# Patient Record
Sex: Male | Born: 1935 | Race: White | Hispanic: No | Smoking: Former smoker
Health system: Southern US, Community
[De-identification: ages and names within clinical notes are randomized; demographics above are authoritative.]

## PROBLEM LIST (undated history)

## (undated) DIAGNOSIS — K219 Gastro-esophageal reflux disease without esophagitis: Secondary | ICD-10-CM

## (undated) DIAGNOSIS — E119 Type 2 diabetes mellitus without complications: Secondary | ICD-10-CM

## (undated) DIAGNOSIS — H3550 Unspecified hereditary retinal dystrophy: Secondary | ICD-10-CM

## (undated) DIAGNOSIS — E7521 Fabry (-Anderson) disease: Secondary | ICD-10-CM

## (undated) DIAGNOSIS — I10 Essential (primary) hypertension: Secondary | ICD-10-CM

## (undated) HISTORY — DX: Fabry (-anderson) disease: E75.21

## (undated) HISTORY — PX: OTHER SURGICAL HISTORY: SHX169

---

## 2012-10-08 LAB — HM COLONOSCOPY

## 2014-07-18 DIAGNOSIS — E785 Hyperlipidemia, unspecified: Secondary | ICD-10-CM | POA: Diagnosis not present

## 2014-07-18 DIAGNOSIS — E119 Type 2 diabetes mellitus without complications: Secondary | ICD-10-CM | POA: Diagnosis not present

## 2014-07-18 DIAGNOSIS — R21 Rash and other nonspecific skin eruption: Secondary | ICD-10-CM | POA: Diagnosis not present

## 2014-07-18 DIAGNOSIS — Z0000 Encounter for general adult medical examination without abnormal findings: Secondary | ICD-10-CM | POA: Diagnosis not present

## 2014-07-18 DIAGNOSIS — G4700 Insomnia, unspecified: Secondary | ICD-10-CM | POA: Diagnosis not present

## 2014-07-18 DIAGNOSIS — F329 Major depressive disorder, single episode, unspecified: Secondary | ICD-10-CM | POA: Diagnosis not present

## 2014-07-18 DIAGNOSIS — I10 Essential (primary) hypertension: Secondary | ICD-10-CM | POA: Diagnosis not present

## 2014-07-18 DIAGNOSIS — Z23 Encounter for immunization: Secondary | ICD-10-CM | POA: Diagnosis not present

## 2014-07-18 DIAGNOSIS — F419 Anxiety disorder, unspecified: Secondary | ICD-10-CM | POA: Diagnosis not present

## 2014-07-27 DIAGNOSIS — I77819 Aortic ectasia, unspecified site: Secondary | ICD-10-CM | POA: Diagnosis not present

## 2014-07-27 DIAGNOSIS — Z136 Encounter for screening for cardiovascular disorders: Secondary | ICD-10-CM | POA: Diagnosis not present

## 2014-07-27 DIAGNOSIS — M899 Disorder of bone, unspecified: Secondary | ICD-10-CM | POA: Diagnosis not present

## 2014-07-27 DIAGNOSIS — M8580 Other specified disorders of bone density and structure, unspecified site: Secondary | ICD-10-CM | POA: Diagnosis not present

## 2015-01-25 DIAGNOSIS — E119 Type 2 diabetes mellitus without complications: Secondary | ICD-10-CM | POA: Diagnosis not present

## 2015-01-25 DIAGNOSIS — I10 Essential (primary) hypertension: Secondary | ICD-10-CM | POA: Diagnosis not present

## 2015-01-25 DIAGNOSIS — F4323 Adjustment disorder with mixed anxiety and depressed mood: Secondary | ICD-10-CM | POA: Diagnosis not present

## 2015-01-25 DIAGNOSIS — F418 Other specified anxiety disorders: Secondary | ICD-10-CM | POA: Diagnosis not present

## 2015-01-25 DIAGNOSIS — E785 Hyperlipidemia, unspecified: Secondary | ICD-10-CM | POA: Diagnosis not present

## 2015-01-25 DIAGNOSIS — Z0000 Encounter for general adult medical examination without abnormal findings: Secondary | ICD-10-CM | POA: Diagnosis not present

## 2015-01-25 DIAGNOSIS — R21 Rash and other nonspecific skin eruption: Secondary | ICD-10-CM | POA: Diagnosis not present

## 2015-06-21 DIAGNOSIS — E119 Type 2 diabetes mellitus without complications: Secondary | ICD-10-CM | POA: Diagnosis not present

## 2015-06-21 DIAGNOSIS — H353213 Exudative age-related macular degeneration, right eye, with inactive scar: Secondary | ICD-10-CM | POA: Diagnosis not present

## 2015-06-21 DIAGNOSIS — H353223 Exudative age-related macular degeneration, left eye, with inactive scar: Secondary | ICD-10-CM | POA: Diagnosis not present

## 2015-06-21 DIAGNOSIS — H539 Unspecified visual disturbance: Secondary | ICD-10-CM | POA: Diagnosis not present

## 2015-07-28 DIAGNOSIS — E7800 Pure hypercholesterolemia, unspecified: Secondary | ICD-10-CM | POA: Diagnosis not present

## 2015-07-28 DIAGNOSIS — I10 Essential (primary) hypertension: Secondary | ICD-10-CM | POA: Diagnosis not present

## 2015-07-28 DIAGNOSIS — E119 Type 2 diabetes mellitus without complications: Secondary | ICD-10-CM | POA: Diagnosis not present

## 2015-07-28 DIAGNOSIS — K2270 Barrett's esophagus without dysplasia: Secondary | ICD-10-CM | POA: Insufficient documentation

## 2015-07-28 DIAGNOSIS — F4329 Adjustment disorder with other symptoms: Secondary | ICD-10-CM | POA: Insufficient documentation

## 2015-07-28 DIAGNOSIS — F5101 Primary insomnia: Secondary | ICD-10-CM | POA: Diagnosis not present

## 2015-07-28 DIAGNOSIS — I77819 Aortic ectasia, unspecified site: Secondary | ICD-10-CM | POA: Insufficient documentation

## 2015-07-28 DIAGNOSIS — F4323 Adjustment disorder with mixed anxiety and depressed mood: Secondary | ICD-10-CM | POA: Diagnosis not present

## 2015-07-28 DIAGNOSIS — Z0000 Encounter for general adult medical examination without abnormal findings: Secondary | ICD-10-CM | POA: Diagnosis not present

## 2015-08-09 DIAGNOSIS — Z9841 Cataract extraction status, right eye: Secondary | ICD-10-CM | POA: Diagnosis not present

## 2015-08-09 DIAGNOSIS — R001 Bradycardia, unspecified: Secondary | ICD-10-CM | POA: Diagnosis not present

## 2015-08-09 DIAGNOSIS — E785 Hyperlipidemia, unspecified: Secondary | ICD-10-CM | POA: Diagnosis not present

## 2015-08-09 DIAGNOSIS — I10 Essential (primary) hypertension: Secondary | ICD-10-CM | POA: Diagnosis not present

## 2015-08-09 DIAGNOSIS — R51 Headache: Secondary | ICD-10-CM | POA: Diagnosis not present

## 2015-08-09 DIAGNOSIS — E119 Type 2 diabetes mellitus without complications: Secondary | ICD-10-CM | POA: Diagnosis not present

## 2015-08-09 DIAGNOSIS — Z9842 Cataract extraction status, left eye: Secondary | ICD-10-CM | POA: Diagnosis not present

## 2015-08-09 DIAGNOSIS — Z7984 Long term (current) use of oral hypoglycemic drugs: Secondary | ICD-10-CM | POA: Diagnosis not present

## 2015-08-09 DIAGNOSIS — G4730 Sleep apnea, unspecified: Secondary | ICD-10-CM | POA: Diagnosis not present

## 2015-08-09 DIAGNOSIS — Z961 Presence of intraocular lens: Secondary | ICD-10-CM | POA: Diagnosis not present

## 2015-08-09 DIAGNOSIS — Z7982 Long term (current) use of aspirin: Secondary | ICD-10-CM | POA: Diagnosis not present

## 2015-08-09 DIAGNOSIS — R531 Weakness: Secondary | ICD-10-CM | POA: Diagnosis not present

## 2015-08-09 DIAGNOSIS — H3530 Unspecified macular degeneration: Secondary | ICD-10-CM | POA: Diagnosis not present

## 2015-08-09 DIAGNOSIS — Z91030 Bee allergy status: Secondary | ICD-10-CM | POA: Diagnosis not present

## 2015-08-09 DIAGNOSIS — H409 Unspecified glaucoma: Secondary | ICD-10-CM | POA: Diagnosis not present

## 2015-08-09 DIAGNOSIS — F17200 Nicotine dependence, unspecified, uncomplicated: Secondary | ICD-10-CM | POA: Diagnosis not present

## 2015-08-09 DIAGNOSIS — Z79899 Other long term (current) drug therapy: Secondary | ICD-10-CM | POA: Diagnosis not present

## 2015-08-09 DIAGNOSIS — G319 Degenerative disease of nervous system, unspecified: Secondary | ICD-10-CM | POA: Diagnosis not present

## 2015-08-14 DIAGNOSIS — N281 Cyst of kidney, acquired: Secondary | ICD-10-CM | POA: Diagnosis not present

## 2015-08-14 DIAGNOSIS — E785 Hyperlipidemia, unspecified: Secondary | ICD-10-CM | POA: Diagnosis not present

## 2015-08-14 DIAGNOSIS — Z7984 Long term (current) use of oral hypoglycemic drugs: Secondary | ICD-10-CM | POA: Diagnosis not present

## 2015-08-14 DIAGNOSIS — F17290 Nicotine dependence, other tobacco product, uncomplicated: Secondary | ICD-10-CM | POA: Diagnosis not present

## 2015-08-14 DIAGNOSIS — Z79899 Other long term (current) drug therapy: Secondary | ICD-10-CM | POA: Diagnosis not present

## 2015-08-14 DIAGNOSIS — Z7982 Long term (current) use of aspirin: Secondary | ICD-10-CM | POA: Diagnosis not present

## 2015-08-14 DIAGNOSIS — K8080 Other cholelithiasis without obstruction: Secondary | ICD-10-CM | POA: Diagnosis not present

## 2015-08-14 DIAGNOSIS — N390 Urinary tract infection, site not specified: Secondary | ICD-10-CM | POA: Diagnosis not present

## 2015-08-14 DIAGNOSIS — Z91030 Bee allergy status: Secondary | ICD-10-CM | POA: Diagnosis not present

## 2015-08-14 DIAGNOSIS — S76211A Strain of adductor muscle, fascia and tendon of right thigh, initial encounter: Secondary | ICD-10-CM | POA: Diagnosis not present

## 2015-08-14 DIAGNOSIS — K8020 Calculus of gallbladder without cholecystitis without obstruction: Secondary | ICD-10-CM | POA: Diagnosis not present

## 2015-08-14 DIAGNOSIS — E119 Type 2 diabetes mellitus without complications: Secondary | ICD-10-CM | POA: Diagnosis not present

## 2015-08-14 DIAGNOSIS — S76212A Strain of adductor muscle, fascia and tendon of left thigh, initial encounter: Secondary | ICD-10-CM | POA: Diagnosis not present

## 2015-08-14 DIAGNOSIS — I10 Essential (primary) hypertension: Secondary | ICD-10-CM | POA: Diagnosis not present

## 2015-08-17 DIAGNOSIS — R1032 Left lower quadrant pain: Secondary | ICD-10-CM | POA: Diagnosis not present

## 2016-01-31 DIAGNOSIS — E1142 Type 2 diabetes mellitus with diabetic polyneuropathy: Secondary | ICD-10-CM | POA: Diagnosis not present

## 2016-01-31 DIAGNOSIS — H409 Unspecified glaucoma: Secondary | ICD-10-CM | POA: Insufficient documentation

## 2016-01-31 DIAGNOSIS — I77819 Aortic ectasia, unspecified site: Secondary | ICD-10-CM | POA: Diagnosis not present

## 2016-01-31 DIAGNOSIS — E119 Type 2 diabetes mellitus without complications: Secondary | ICD-10-CM | POA: Diagnosis not present

## 2016-01-31 DIAGNOSIS — F332 Major depressive disorder, recurrent severe without psychotic features: Secondary | ICD-10-CM | POA: Diagnosis not present

## 2016-01-31 DIAGNOSIS — I10 Essential (primary) hypertension: Secondary | ICD-10-CM | POA: Diagnosis not present

## 2016-02-16 DIAGNOSIS — R899 Unspecified abnormal finding in specimens from other organs, systems and tissues: Secondary | ICD-10-CM | POA: Diagnosis not present

## 2016-03-06 DIAGNOSIS — R899 Unspecified abnormal finding in specimens from other organs, systems and tissues: Secondary | ICD-10-CM | POA: Diagnosis not present

## 2016-04-26 DIAGNOSIS — N390 Urinary tract infection, site not specified: Secondary | ICD-10-CM | POA: Diagnosis not present

## 2016-05-20 ENCOUNTER — Emergency Department (HOSPITAL_COMMUNITY): Payer: Medicare Other

## 2016-05-20 ENCOUNTER — Encounter (HOSPITAL_COMMUNITY): Payer: Self-pay | Admitting: *Deleted

## 2016-05-20 ENCOUNTER — Emergency Department (HOSPITAL_COMMUNITY)
Admission: EM | Admit: 2016-05-20 | Discharge: 2016-05-20 | Disposition: A | Discharge status: Home / Self Care | Payer: Medicare Other | Attending: Emergency Medicine | Admitting: Emergency Medicine

## 2016-05-20 DIAGNOSIS — R443 Hallucinations, unspecified: Secondary | ICD-10-CM | POA: Insufficient documentation

## 2016-05-20 DIAGNOSIS — E119 Type 2 diabetes mellitus without complications: Secondary | ICD-10-CM | POA: Diagnosis not present

## 2016-05-20 DIAGNOSIS — N390 Urinary tract infection, site not specified: Secondary | ICD-10-CM | POA: Diagnosis not present

## 2016-05-20 DIAGNOSIS — I10 Essential (primary) hypertension: Secondary | ICD-10-CM | POA: Diagnosis not present

## 2016-05-20 DIAGNOSIS — B354 Tinea corporis: Secondary | ICD-10-CM | POA: Insufficient documentation

## 2016-05-20 DIAGNOSIS — R410 Disorientation, unspecified: Secondary | ICD-10-CM | POA: Diagnosis not present

## 2016-05-20 DIAGNOSIS — R208 Other disturbances of skin sensation: Secondary | ICD-10-CM | POA: Diagnosis present

## 2016-05-20 HISTORY — DX: Essential (primary) hypertension: I10

## 2016-05-20 HISTORY — DX: Type 2 diabetes mellitus without complications: E11.9

## 2016-05-20 HISTORY — DX: Gastro-esophageal reflux disease without esophagitis: K21.9

## 2016-05-20 HISTORY — DX: Unspecified hereditary retinal dystrophy: H35.50

## 2016-05-20 LAB — CBC WITH DIFFERENTIAL/PLATELET
Basophils Absolute: 0 10*3/uL (ref 0.0–0.1)
Basophils Relative: 0 %
Eosinophils Absolute: 0.3 10*3/uL (ref 0.0–0.7)
Eosinophils Relative: 5 %
HCT: 37.5 % — ABNORMAL LOW (ref 39.0–52.0)
Hemoglobin: 12.6 g/dL — ABNORMAL LOW (ref 13.0–17.0)
Lymphocytes Relative: 23 %
Lymphs Abs: 1.7 10*3/uL (ref 0.7–4.0)
MCH: 31.8 pg (ref 26.0–34.0)
MCHC: 33.6 g/dL (ref 30.0–36.0)
MCV: 94.7 fL (ref 78.0–100.0)
Monocytes Absolute: 0.8 10*3/uL (ref 0.1–1.0)
Monocytes Relative: 11 %
Neutro Abs: 4.5 10*3/uL (ref 1.7–7.7)
Neutrophils Relative %: 61 %
Platelets: 228 10*3/uL (ref 150–400)
RBC: 3.96 MIL/uL — ABNORMAL LOW (ref 4.22–5.81)
RDW: 13.7 % (ref 11.5–15.5)
WBC: 7.4 10*3/uL (ref 4.0–10.5)

## 2016-05-20 LAB — URINALYSIS, ROUTINE W REFLEX MICROSCOPIC
Bilirubin Urine: NEGATIVE
Glucose, UA: NEGATIVE mg/dL
Hgb urine dipstick: NEGATIVE
Ketones, ur: 5 mg/dL — AB
Nitrite: NEGATIVE
Protein, ur: NEGATIVE mg/dL
Specific Gravity, Urine: 1.021 (ref 1.005–1.030)
pH: 5 (ref 5.0–8.0)

## 2016-05-20 LAB — BASIC METABOLIC PANEL
Anion gap: 7 (ref 5–15)
BUN: 29 mg/dL — ABNORMAL HIGH (ref 6–20)
CO2: 28 mmol/L (ref 22–32)
Calcium: 9.5 mg/dL (ref 8.9–10.3)
Chloride: 103 mmol/L (ref 101–111)
Creatinine, Ser: 1.43 mg/dL — ABNORMAL HIGH (ref 0.61–1.24)
GFR calc Af Amer: 50 mL/min — ABNORMAL LOW (ref >60)
GFR calc non Af Amer: 43 mL/min — ABNORMAL LOW (ref >60)
Glucose, Bld: 129 mg/dL — ABNORMAL HIGH (ref 65–99)
Potassium: 4.3 mmol/L (ref 3.5–5.1)
Sodium: 138 mmol/L (ref 135–145)

## 2016-05-20 MED ORDER — CLOTRIMAZOLE 1 % EX CREA: 1.0000 "application " | TOPICAL_CREAM | Freq: Two times a day (BID) | CUTANEOUS | 0 refills | Status: AC

## 2016-05-20 MED ORDER — CEPHALEXIN 500 MG PO CAPS: 500.0000 mg | ORAL_CAPSULE | Freq: Two times a day (BID) | ORAL | 0 refills | Status: DC

## 2016-05-20 MED ORDER — QUETIAPINE FUMARATE 25 MG PO TABS: 25.0000 mg | ORAL_TABLET | Freq: Every day | ORAL | 0 refills | Status: DC

## 2016-05-20 MED ORDER — CEPHALEXIN 250 MG PO CAPS
500.0000 mg | ORAL_CAPSULE | Freq: Once | ORAL | Status: AC
  Administered 2016-05-20: 500 mg via ORAL
  Filled 2016-05-20: qty 2

## 2016-05-20 NOTE — ED Notes (Signed)
Got patient into a gown and on the monitor waiting for provider 

## 2016-05-20 NOTE — ED Notes (Signed)
Patient transported to CT 

## 2016-05-20 NOTE — ED Triage Notes (Signed)
Pt here with daughter for increasing confusion and hallucinations.  Until 9 weeks ago pt was ao x 4.  Then his pcp placed him on cymbalta for burning sensation in his feet and he began having hallucinations. 2 weeks later they stopped he med, but pt is not improving.  Pt is now not sleeping and is wanting to carry a gun to protect himself.  He is 95% blind and he is pointing out people in the yard who want to kill him.  Pt is also on ativan and has been smoking a lot more than normal.

## 2016-05-20 NOTE — ED Notes (Signed)
Pt unable to obtain urine specimen at this time, states "he will try in a little while when he feels like he has to go."

## 2016-05-20 NOTE — ED Provider Notes (Signed)
[facility]-EMERGENCY DEPT Provider Note   CSN: [id number] Arrival date & time: 05/20/16  [id number] By signing my name below, I, Sonum Patel, attest that this documentation has been prepared under the direction and in the presence of Raeford RazorStephen Stanislav Gervase, MD. Electronically Signed: Sonum Patel, Neurosurgeoncribe. 05/20/16. 10:19 AM. History   Chief Complaint Chief Complaint  Patient presents with  . Altered Mental Status    The history is provided by the patient and a relative. The history is limited by the condition of the patient. No language interpreter was used.    LEVEL 5 CAVEAT: ALTERED MENTAL STATUS HPI Comments: Thomasene MohairCharles Umland is a 82 y.o. male with past medical history of DM, HTN, macular dystrophy who presents to the Emergency Department with altered mental status that began 9 weeks ago. Daughters state patient was started on Cymbalta for burning sensations to his feet. They state patient began having hallucinations and became paranoid after starting the medication. He has stopped taking the medication 7 weeks ago but family states the symptoms are persisting. He has worsened wandering and insomnia. Family states patient is legally blind but reports seeing people who want to kill him. He was evaluated for a UTI a few weeks ago and was treated with antibiotics; family states this did not alleviate his psychological symptoms. He has a history of DM but was recently taken off of glipizide with CBGs of ~130. He has scheduled lorazepam but family states this has not been helpful.   PCP [facility]  Past Medical History:  Diagnosis Date  . Diabetes mellitus without complication (HCC)   . GERD (gastroesophageal reflux disease)   . Hypertension   . Macular dystrophy     There are no active problems to display for this patient.   Past Surgical History:  Procedure Laterality Date  . cataract Bilateral        Home Medications    Prior to Admission medications   Not on File    Family History No  family history on file.  Social History Social History  Substance Use Topics  . Smoking status: Never Smoker  . Smokeless tobacco: Current User    Types: Chew  . Alcohol use No     Allergies   Bee venom; Cymbalta [duloxetine hcl]; and Zoloft [sertraline hcl]   Review of Systems Review of Systems  Unable to perform ROS: Mental status change     Physical Exam Updated Vital Signs BP 127/64 (BP Location: Right Arm)   Pulse 72   Temp 97.5 F (36.4 C) (Oral)   Resp 20   Ht 5\' 11"  (1.803 m)   Wt 174 lb (78.9 kg)   SpO2 98%   BMI 24.27 kg/m   Physical Exam  Constitutional: He appears well-developed and well-nourished.  HENT:  Head: Normocephalic and atraumatic.  Eyes: EOM are normal.  Neck: Normal range of motion.  Cardiovascular: Normal rate, regular rhythm, normal heart sounds and intact distal pulses.   Pulmonary/Chest: Effort normal and breath sounds normal. No respiratory distress.  Abdominal: Soft. He exhibits no distension. There is no tenderness.  Musculoskeletal: Normal range of motion.  Neurological: He is alert.  Pleasant, follows commands. Mildly confused, repetitive questioning to family about people being across the street and burning things down. He is redirectable briefly.   Skin: Skin is warm and dry.  To the posterior right thigh there is a roughly circular lesion approximately 4 cm in diameter. Border is slightly raised and a little bit erythematous. Central part of the lesion  is pale in color and scaly. Overall, looks consistent with ringworm.  Psychiatric: He has a normal mood and affect. Judgment normal.  Nursing note and vitals reviewed.    ED Treatments / Results  DIAGNOSTIC STUDIES: Oxygen Saturation is 98% on RA, normal by my interpretation.    COORDINATION OF CARE: 10:22 AM Discussed treatment plan with pt and family at bedside and they agreed to plan.   Labs (all labs ordered are listed, but only abnormal results are displayed) Labs  Reviewed  URINE CULTURE - Abnormal; Notable for the following:       Result Value   Culture MULTIPLE SPECIES PRESENT, SUGGEST RECOLLECTION (*)    All other components within normal limits  CBC WITH DIFFERENTIAL/PLATELET - Abnormal; Notable for the following:    RBC 3.96 (*)    Hemoglobin 12.6 (*)    HCT 37.5 (*)    All other components within normal limits  BASIC METABOLIC PANEL - Abnormal; Notable for the following:    Glucose, Bld 129 (*)    BUN 29 (*)    Creatinine, Ser 1.43 (*)    GFR calc non Af Amer 43 (*)    GFR calc Af Amer 50 (*)    All other components within normal limits  URINALYSIS, ROUTINE W REFLEX MICROSCOPIC - Abnormal; Notable for the following:    APPearance HAZY (*)    Ketones, ur 5 (*)    Leukocytes, UA LARGE (*)    Bacteria, UA RARE (*)    Squamous Epithelial / LPF 0-5 (*)    All other components within normal limits    EKG  EKG Interpretation None       Radiology No results found.  Procedures Procedures (including critical care time)  Medications Ordered in ED Medications - No data to display   Initial Impression / Assessment and Plan / ED Course  I have reviewed the triage vital signs and the nursing notes.  Pertinent labs & imaging results that were available during my care of the patient were reviewed by me and considered in my medical decision making (see chart for details).  Clinical Course     82 year old male with change in his mental status. This is been ongoing for weeks. He may have urinary tract infection but this does not completely explain the timeline. Will treat him. Will send urine culture. Additionally, he has a rash to his posterior right thigh which is consistent with ringworm. Treatment was discussed with the family. Medically, actually safer discharge. The nasal follow-up this PCP with his family to discuss his behavior changes further. I doubt that there emergent process and this is not a complain that can really  adequately be addressed the emergency room or hospitalization.  Final Clinical Impressions(s) / ED Diagnoses   Final diagnoses:  Hallucinations  Ringworm of body  Urinary tract infection without hematuria, site unspecified    New Prescriptions New Prescriptions   No medications on file   I personally preformed the services scribed in my presence. The recorded information has been reviewed is accurate. Raeford RazorStephen Neisha Hinger, MD.    Raeford RazorStephen Hawk Mones, MD 05/28/16 1100

## 2016-05-20 NOTE — ED Notes (Signed)
Pt given urinal to provide urine sample

## 2016-05-21 LAB — URINE CULTURE

## 2016-06-12 ENCOUNTER — Ambulatory Visit (INDEPENDENT_AMBULATORY_CARE_PROVIDER_SITE_OTHER): Payer: Medicare Other | Admitting: Internal Medicine

## 2016-06-12 ENCOUNTER — Encounter: Payer: Self-pay | Admitting: Internal Medicine

## 2016-06-12 VITALS — BP 118/68 | HR 63 | Temp 97.6°F | Ht 71.0 in | Wt 166.0 lb

## 2016-06-12 DIAGNOSIS — E1142 Type 2 diabetes mellitus with diabetic polyneuropathy: Secondary | ICD-10-CM | POA: Diagnosis not present

## 2016-06-12 DIAGNOSIS — G4700 Insomnia, unspecified: Secondary | ICD-10-CM | POA: Diagnosis not present

## 2016-06-12 DIAGNOSIS — R443 Hallucinations, unspecified: Secondary | ICD-10-CM

## 2016-06-12 DIAGNOSIS — K219 Gastro-esophageal reflux disease without esophagitis: Secondary | ICD-10-CM

## 2016-06-12 DIAGNOSIS — E782 Mixed hyperlipidemia: Secondary | ICD-10-CM

## 2016-06-12 DIAGNOSIS — F4329 Adjustment disorder with other symptoms: Secondary | ICD-10-CM | POA: Diagnosis not present

## 2016-06-12 DIAGNOSIS — B354 Tinea corporis: Secondary | ICD-10-CM

## 2016-06-12 DIAGNOSIS — H548 Legal blindness, as defined in USA: Secondary | ICD-10-CM | POA: Diagnosis not present

## 2016-06-12 DIAGNOSIS — I10 Essential (primary) hypertension: Secondary | ICD-10-CM

## 2016-06-12 LAB — TSH: TSH: 2.11 m[IU]/L (ref 0.40–4.50)

## 2016-06-12 LAB — HEMOGLOBIN A1C
HEMOGLOBIN A1C: 6 % — AB (ref <5.7)
Mean Plasma Glucose: 126 mg/dL

## 2016-06-12 LAB — LIPID PANEL
Cholesterol: 211 mg/dL — ABNORMAL HIGH (ref <200)
HDL: 59 mg/dL (ref >40)
LDL Cholesterol: 129 mg/dL — ABNORMAL HIGH (ref <100)
Total CHOL/HDL Ratio: 3.6 Ratio (ref <5.0)
Triglycerides: 115 mg/dL (ref <150)
VLDL: 23 mg/dL (ref <30)

## 2016-06-12 LAB — COMPLETE METABOLIC PANEL WITH GFR
ALK PHOS: 114 U/L (ref 40–115)
ALT: 12 U/L (ref 9–46)
AST: 20 U/L (ref 10–35)
Albumin: 3.7 g/dL (ref 3.6–5.1)
BILIRUBIN TOTAL: 0.8 mg/dL (ref 0.2–1.2)
BUN: 38 mg/dL — ABNORMAL HIGH (ref 7–25)
CO2: 28 mmol/L (ref 20–31)
CREATININE: 1.37 mg/dL — AB (ref 0.70–1.11)
Calcium: 9.5 mg/dL (ref 8.6–10.3)
Chloride: 103 mmol/L (ref 98–110)
GFR, EST AFRICAN AMERICAN: 54 mL/min — AB (ref >=60)
GFR, EST NON AFRICAN AMERICAN: 47 mL/min — AB (ref >=60)
GLUCOSE: 139 mg/dL — AB (ref 65–99)
Potassium: 4.3 mmol/L (ref 3.5–5.3)
Sodium: 140 mmol/L (ref 135–146)
TOTAL PROTEIN: 6.4 g/dL (ref 6.1–8.1)

## 2016-06-12 MED ORDER — QUETIAPINE FUMARATE 25 MG PO TABS: 25.0000 mg | ORAL_TABLET | Freq: Every day | ORAL | 0 refills | Status: DC

## 2016-06-12 MED ORDER — MOEXIPRIL HCL 15 MG PO TABS: 15.0000 mg | ORAL_TABLET | Freq: Every day | ORAL | 1 refills | Status: DC

## 2016-06-12 MED ORDER — QUETIAPINE FUMARATE 25 MG PO TABS: 25.0000 mg | ORAL_TABLET | Freq: Every day | ORAL | 1 refills | Status: DC

## 2016-06-12 MED ORDER — KETOCONAZOLE 200 MG PO TABS: 200.0000 mg | ORAL_TABLET | Freq: Every day | ORAL | 0 refills | Status: DC

## 2016-06-12 MED ORDER — OMEPRAZOLE 20 MG PO TBEC: 40.0000 mg | DELAYED_RELEASE_TABLET | Freq: Two times a day (BID) | ORAL | 1 refills | Status: DC

## 2016-06-12 MED ORDER — ZOLPIDEM TARTRATE 5 MG PO TABS: 5.0000 mg | ORAL_TABLET | Freq: Every evening | ORAL | 3 refills | Status: DC | PRN

## 2016-06-12 MED ORDER — LORAZEPAM 0.5 MG PO TABS: 0.5000 mg | ORAL_TABLET | Freq: Two times a day (BID) | ORAL | 3 refills | Status: DC

## 2016-06-12 NOTE — Patient Instructions (Addendum)
Reduce zolpidem 5mg  at bedtime  Start ketoconazole daily x 14 days for rash  Continue other medications as ordered  Get old records  Follow up in 1-2 mos for AWV/CPE with MMSE/ECG

## 2016-06-12 NOTE — Progress Notes (Signed)
Patient ID: Brendan Spencer, male   DOB: [birth year], 83 y.o.   MRN: [id number]    Location:  [facility] Place of Service: OFFICE  Chief Complaint  Patient presents with  . Establish Care    New patient to establish care  . Medication Management    patient was taking off meds and ended up in hospital    HPI:  83 yo male seen today as a new pt. He has had decline in health over the last few mos. Increased visual and auditory hallucinations. Sx's began after starting cymbalta. He was evaluated in the ED at [facility] and dx with UTI tx with keflex and ring worm tx with OTC lotrimin. He has since stopped that medication and is now taking seroquel which was started on 12/11th. Family reports pt has delusions and hallucinations. He is c/a neighbors and states that they are damaging his home and they have an electronic machine that controls his TV, heat and they watch him shower. He hears voices that threaten they are going to kill him. Family c/a pt as he owns a weapon (.22 pistol) and keeps in the hallway end table beside his chair.  He has insomnia and difficulty staying asleep. He has no other concerns. He is a poor historian due to mental status. Hx obtained from chart and family  DM - last A1c some time ago and was <7%. Oral hypoglycemic stopped by previous PCP. No low BS reactions. No numbness/tingling  HTN - stable on ACEI  GERD - stable on omeprazole  Hyperlipidemia - currently diet controlled  Past Medical History:  Diagnosis Date  . Diabetes mellitus without complication (HCC)   . GERD (gastroesophageal reflux disease)   . GLA deficiency (HCC)   . Hypertension   . Macular dystrophy     Past Surgical History:  Procedure Laterality Date  . cataract Bilateral     Patient Care Team: Kirt Boys, DO as PCP - General (Internal Medicine)  Social History   Social History  . Marital status: Widowed    Spouse name: N/A  . Number of children: N/A  . Years of education: N/A    Occupational History  . Not on file.   Social History Main Topics  . Smoking status: Former Games developer  . Smokeless tobacco: Current User    Types: Chew  . Alcohol use No  . Drug use: No  . Sexual activity: Not on file   Other Topics Concern  . Not on file   Social History Narrative  . No narrative on file     reports that he has quit smoking. His smokeless tobacco use includes Chew. He reports that he does not drink alcohol or use drugs.  History reviewed. No pertinent family history. No family status information on file.     Allergies  Allergen Reactions  . Bee Venom Anaphylaxis  . Cymbalta [Duloxetine Hcl]     Hallucinations   . Zoloft [Sertraline Hcl]     Hallucinations; incontinence     Medications: Patient's Medications  New Prescriptions   No medications on file  Previous Medications   ACETAMINOPHEN (TYLENOL) 325 MG TABLET    Take 650 mg by mouth every 6 (six) hours as needed.   CHOLECALCIFEROL (D3-1000) 1000 UNITS TABLET    Take 1,000 Units by mouth daily.   CLOTRIMAZOLE (LOTRIMIN) 1 % CREAM    Apply 1 application topically 2 (two) times daily.   HYDROCODONE-ACETAMINOPHEN (NORCO) 10-325 MG TABLET    Take 1 tablet  by mouth every 6 (six) hours as needed.   LORAZEPAM (ATIVAN) 1 MG TABLET    Take 1 mg by mouth every 8 (eight) hours.   LUMIGAN 0.01 % SOLN    Place 2 drops into both eyes 2 (two) times daily.   MAGNESIUM OXIDE (MAG-OX) 400 MG TABLET    Take 400 mg by mouth daily.   MOEXIPRIL (UNIVASC) 15 MG TABLET    Take 15 mg by mouth daily.   MULTIPLE VITAMINS-MINERALS (PRESERVISION AREDS PO)    Take by mouth. Take 1 capsule in the morning and 1 capsule in the evening   OMEPRAZOLE 20 MG TBEC    Take 40 mg by mouth 2 (two) times daily before a meal.    PSYLLIUM (REGULOID) 0.52 G CAPSULE    Take 0.52 g by mouth daily.   QUETIAPINE (SEROQUEL) 25 MG TABLET    Take 1 tablet (25 mg total) by mouth at bedtime.  Modified Medications   No medications on file   Discontinued Medications   CEPHALEXIN (KEFLEX) 500 MG CAPSULE    Take 1 capsule (500 mg total) by mouth 2 (two) times daily.   OMEPRAZOLE (PRILOSEC) 40 MG CAPSULE    Take 40 mg by mouth 2 (two) times daily.   ZOLPIDEM (AMBIEN) 10 MG TABLET    Take 10 mg by mouth at bedtime as needed. SLEEP    Review of Systems  Constitutional: Positive for appetite change (LOSS), chills, fatigue and unexpected weight change (loss).  HENT: Positive for dental problem (wears dentures).        Nasal drainage  Eyes: Positive for pain and visual disturbance (reduced).  Respiratory: Positive for shortness of breath.   Gastrointestinal: Positive for constipation.       Hemorrhoids  Endocrine: Positive for cold intolerance, heat intolerance, polydipsia and polyuria.  Genitourinary: Positive for difficulty urinating (weak stream), frequency and urgency.  Musculoskeletal: Positive for arthralgias, gait problem and myalgias.  Skin:       itchy  Neurological: Positive for numbness and headaches.       Loss of balance;  Hematological: Bruises/bleeds easily.  Psychiatric/Behavioral: Positive for dysphoric mood, hallucinations (auditory; visual) and sleep disturbance. The patient is nervous/anxious.   All other systems reviewed and are negative. taken from new patient packet  Vitals:   06/12/16 0859  BP: 118/68  Pulse: 63  Temp: 97.6 F (36.4 C)  TempSrc: Oral  SpO2: 94%  Weight: 166 lb (75.3 kg)  Height: '5\' 11"'$  (1.803 m)   Body mass index is 23.15 kg/m.  Physical Exam  Constitutional: He is oriented to person, place, and time. He appears well-developed.  Frail appearing in NAD  HENT:  Mouth/Throat: Oropharynx is clear and moist.  Eyes: Pupils are equal, round, and reactive to light. No scleral icterus.  Neck: Neck supple. Carotid bruit is not present. No thyromegaly present.  Cardiovascular: Normal rate, regular rhythm, normal heart sounds and intact distal pulses.  Exam reveals no gallop and no  friction rub.   No murmur heard. no distal LE swelling. No calf TTP  Pulmonary/Chest: Effort normal and breath sounds normal. He has no wheezes. He has no rales. He exhibits no tenderness.  Abdominal: Soft. Bowel sounds are normal. He exhibits no distension, no abdominal bruit, no pulsatile midline mass and no mass. There is no hepatomegaly. There is no tenderness. There is no rebound and no guarding.  Lymphadenopathy:    He has no cervical adenopathy.  Neurological: He is alert and oriented to  person, place, and time. He has normal reflexes.  Skin: Skin is warm and dry. Rash (well circumscribed red blancheable rash generalized) noted.  Right forearm tattoo  Psychiatric: He has a normal mood and affect. His behavior is normal. Thought content is delusional. Cognition and memory are impaired. He expresses no suicidal plans and no homicidal plans.     Labs reviewed: Office Visit on 06/12/2016  Component Date Value Ref Range Status  . HM Colonoscopy 10/08/2012 Patient Reported  See Report (in chart), Patient Reported Final  Admission on 05/20/2016, Discharged on 05/20/2016  Component Date Value Ref Range Status  . WBC 05/20/2016 7.4  4.0 - 10.5 K/uL Final  . RBC 05/20/2016 3.96* 4.22 - 5.81 MIL/uL Final  . Hemoglobin 05/20/2016 12.6* 13.0 - 17.0 g/dL Final  . HCT 05/20/2016 37.5* 39.0 - 52.0 % Final  . MCV 05/20/2016 94.7  78.0 - 100.0 fL Final  . MCH 05/20/2016 31.8  26.0 - 34.0 pg Final  . MCHC 05/20/2016 33.6  30.0 - 36.0 g/dL Final  . RDW 05/20/2016 13.7  11.5 - 15.5 % Final  . Platelets 05/20/2016 228  150 - 400 K/uL Final  . Neutrophils Relative % 05/20/2016 61  % Final  . Neutro Abs 05/20/2016 4.5  1.7 - 7.7 K/uL Final  . Lymphocytes Relative 05/20/2016 23  % Final  . Lymphs Abs 05/20/2016 1.7  0.7 - 4.0 K/uL Final  . Monocytes Relative 05/20/2016 11  % Final  . Monocytes Absolute 05/20/2016 0.8  0.1 - 1.0 K/uL Final  . Eosinophils Relative 05/20/2016 5  % Final  . Eosinophils  Absolute 05/20/2016 0.3  0.0 - 0.7 K/uL Final  . Basophils Relative 05/20/2016 0  % Final  . Basophils Absolute 05/20/2016 0.0  0.0 - 0.1 K/uL Final  . Sodium 05/20/2016 138  135 - 145 mmol/L Final  . Potassium 05/20/2016 4.3  3.5 - 5.1 mmol/L Final  . Chloride 05/20/2016 103  101 - 111 mmol/L Final  . CO2 05/20/2016 28  22 - 32 mmol/L Final  . Glucose, Bld 05/20/2016 129* 65 - 99 mg/dL Final  . BUN 05/20/2016 29* 6 - 20 mg/dL Final  . Creatinine, Ser 05/20/2016 1.43* 0.61 - 1.24 mg/dL Final  . Calcium 05/20/2016 9.5  8.9 - 10.3 mg/dL Final  . GFR calc non Af Amer 05/20/2016 43* >60 mL/min Final  . GFR calc Af Amer 05/20/2016 50* >60 mL/min Final   Comment: (NOTE) The eGFR has been calculated using the CKD EPI equation. This calculation has not been validated in all clinical situations. eGFR's persistently <60 mL/min signify possible Chronic Kidney Disease.   . Anion gap 05/20/2016 7  5 - 15 Final  . Color, Urine 05/20/2016 YELLOW  YELLOW Final  . APPearance 05/20/2016 HAZY* CLEAR Final  . Specific Gravity, Urine 05/20/2016 1.021  1.005 - 1.030 Final  . pH 05/20/2016 5.0  5.0 - 8.0 Final  . Glucose, UA 05/20/2016 NEGATIVE  NEGATIVE mg/dL Final  . Hgb urine dipstick 05/20/2016 NEGATIVE  NEGATIVE Final  . Bilirubin Urine 05/20/2016 NEGATIVE  NEGATIVE Final  . Ketones, ur 05/20/2016 5* NEGATIVE mg/dL Final  . Protein, ur 05/20/2016 NEGATIVE  NEGATIVE mg/dL Final  . Nitrite 05/20/2016 NEGATIVE  NEGATIVE Final  . Leukocytes, UA 05/20/2016 LARGE* NEGATIVE Final  . RBC / HPF 05/20/2016 6-30  0 - 5 RBC/hpf Final  . WBC, UA 05/20/2016 6-30  0 - 5 WBC/hpf Final  . Bacteria, UA 05/20/2016 RARE* NONE SEEN Final  .  Squamous Epithelial / LPF 05/20/2016 0-5* NONE SEEN Final  . Mucous 05/20/2016 PRESENT   Final  . Hyaline Casts, UA 05/20/2016 PRESENT   Final  . Ca Oxalate Crys, UA 05/20/2016 PRESENT   Final  . Specimen Description 05/21/2016 URINE, CLEAN CATCH   Final  . Special Requests  05/21/2016 NONE   Final  . Culture 05/21/2016 MULTIPLE SPECIES PRESENT, SUGGEST RECOLLECTION*  Final  . Report Status 05/21/2016 05/21/2016 FINAL   Final    Ct Head Wo Contrast  Result Date: 05/20/2016 CLINICAL DATA:  Altered mental status. Increasing confusion and hallucinations. EXAM: CT HEAD WITHOUT CONTRAST TECHNIQUE: Contiguous axial images were obtained from the base of the skull through the vertex without intravenous contrast. COMPARISON:  04/07/2014 FINDINGS: Brain: There is no evidence of acute cortical infarct, intracranial hemorrhage, mass, midline shift, or extra-axial fluid collection. There is mild generalized cerebral atrophy, unchanged. Periventricular white matter hypodensities are nonspecific but compatible with mild chronic small vessel ischemic disease. Vascular: Calcified atherosclerosis at the skullbase. No hyperdense vessel. Skull: No fracture or focal osseous lesion. Sinuses/Orbits: The mild paranasal sinus mucosal thickening. Clear mastoid air cells. Prior bilateral cataract extraction. Other: None. IMPRESSION: 1. No evidence of acute intracranial abnormality. 2. Mild chronic small vessel ischemic disease. Electronically Signed   By: Logan Bores M.D.   On: 05/20/2016 11:57     Assessment/Plan   ICD-9-CM ICD-10-CM   1. Tinea corporis 110.5 B35.4 ketoconazole (NIZORAL) 200 MG tablet  2. Type 2 diabetes mellitus with diabetic polyneuropathy, without long-term current use of insulin (HCC) 250.60 E11.42 CMP with eGFR   357.2  Hemoglobin A1c     Microalbumin/Creatinine Ratio, Urine     Urinalysis with Reflex Microscopic  3. Essential hypertension 401.9 I10 DISCONTINUED: moexipril (UNIVASC) 15 MG tablet  4. Gastroesophageal reflux disease without esophagitis 530.81 K21.9 DISCONTINUED: Omeprazole 20 MG TBEC  5. Mixed hyperlipidemia 272.2 E78.2 Lipid Panel     TSH     Lipid panel     ALT  6. Hallucinations 780.1 R44.3 QUEtiapine (SEROQUEL) 25 MG tablet     DISCONTINUED:  QUEtiapine (SEROQUEL) 25 MG tablet  7. Legally blind 369.4 H54.8    due to macular degeneration  8. Mixed emotional features as adjustment reaction 309.9 F43.29 LORazepam (ATIVAN) 0.5 MG tablet  9. Insomnia, unspecified type 780.52 G47.00 zolpidem (AMBIEN) 5 MG tablet   Reduce zolpidem 17m at bedtime  Start ketoconazole daily x 14 days for rash  Continue other medications as ordered  Get old records  Follow up in 1-2 mos for AWV/CPE with MMSE/ECG   Camdin Hegner S. CPerlie Gold PDwight D. Eisenhower Va Medical Centerand Adult Medicine [address] [phone] (Monday-Friday 8 AM - 5 PM) ([phone] 5 PM and follow prompts

## 2016-06-13 ENCOUNTER — Other Ambulatory Visit: Payer: Self-pay

## 2016-06-13 DIAGNOSIS — I10 Essential (primary) hypertension: Secondary | ICD-10-CM

## 2016-06-13 DIAGNOSIS — K219 Gastro-esophageal reflux disease without esophagitis: Secondary | ICD-10-CM

## 2016-06-13 LAB — URINALYSIS, ROUTINE W REFLEX MICROSCOPIC
Bilirubin Urine: NEGATIVE
Glucose, UA: NEGATIVE
HGB URINE DIPSTICK: NEGATIVE
KETONES UR: NEGATIVE
NITRITE: NEGATIVE
PH: 6 (ref 5.0–8.0)
Protein, ur: NEGATIVE
Specific Gravity, Urine: 1.025 (ref 1.001–1.035)

## 2016-06-13 LAB — URINALYSIS, MICROSCOPIC ONLY
CRYSTALS: NONE SEEN [HPF]
Casts: NONE SEEN [LPF]
YEAST: NONE SEEN [HPF]

## 2016-06-13 LAB — MICROALBUMIN / CREATININE URINE RATIO
CREATININE, URINE: 198 mg/dL (ref 20–370)
MICROALB UR: 0.6 mg/dL
Microalb Creat Ratio: 3 mcg/mg creat (ref <30)

## 2016-06-13 MED ORDER — OMEPRAZOLE 20 MG PO TBEC: 40.0000 mg | DELAYED_RELEASE_TABLET | Freq: Two times a day (BID) | ORAL | 1 refills | Status: DC

## 2016-06-13 NOTE — Telephone Encounter (Signed)
1.) [facility] requesting a 90 day supply for Omeprazole #360. RX was submitted yesterday for #120 and this does not maximize patient mail order benefits.  RX resubmitted for #360  2.) Univasc is no longer manufactured, patient will need an alternative medication. Please advise

## 2016-06-14 ENCOUNTER — Other Ambulatory Visit: Payer: Self-pay | Admitting: *Deleted

## 2016-06-14 DIAGNOSIS — K219 Gastro-esophageal reflux disease without esophagitis: Secondary | ICD-10-CM

## 2016-06-14 DIAGNOSIS — I10 Essential (primary) hypertension: Secondary | ICD-10-CM

## 2016-06-14 MED ORDER — MOEXIPRIL HCL 15 MG PO TABS: 15.0000 mg | ORAL_TABLET | Freq: Every day | ORAL | 3 refills | Status: DC

## 2016-06-14 MED ORDER — OMEPRAZOLE 20 MG PO TBEC: 40.0000 mg | DELAYED_RELEASE_TABLET | Freq: Two times a day (BID) | ORAL | 3 refills | Status: DC

## 2016-06-14 MED ORDER — ATORVASTATIN CALCIUM 10 MG PO TABS: 10.0000 mg | ORAL_TABLET | Freq: Every day | ORAL | 6 refills | Status: AC

## 2016-06-14 NOTE — Telephone Encounter (Signed)
STOP univasc  Rx benazepril 10mg  #90 take 1 tab po daily for blood pressure with 1 RF

## 2016-06-14 NOTE — Telephone Encounter (Signed)
[facility]

## 2016-06-16 DIAGNOSIS — I10 Essential (primary) hypertension: Secondary | ICD-10-CM | POA: Insufficient documentation

## 2016-06-16 DIAGNOSIS — G4700 Insomnia, unspecified: Secondary | ICD-10-CM | POA: Insufficient documentation

## 2016-06-16 DIAGNOSIS — E1142 Type 2 diabetes mellitus with diabetic polyneuropathy: Secondary | ICD-10-CM | POA: Insufficient documentation

## 2016-06-16 DIAGNOSIS — K219 Gastro-esophageal reflux disease without esophagitis: Secondary | ICD-10-CM | POA: Insufficient documentation

## 2016-06-16 DIAGNOSIS — E782 Mixed hyperlipidemia: Secondary | ICD-10-CM | POA: Insufficient documentation

## 2016-06-16 DIAGNOSIS — H548 Legal blindness, as defined in USA: Secondary | ICD-10-CM | POA: Insufficient documentation

## 2016-06-17 MED ORDER — BENAZEPRIL HCL 10 MG PO TABS: 10.0000 mg | ORAL_TABLET | Freq: Every day | ORAL | 1 refills | Status: DC

## 2016-06-17 NOTE — Telephone Encounter (Signed)
Spoke with patient's daughter to inform her of medication change. RX sent to pharmacy.

## 2016-07-02 ENCOUNTER — Other Ambulatory Visit: Payer: Self-pay | Admitting: *Deleted

## 2016-07-02 DIAGNOSIS — F4329 Adjustment disorder with other symptoms: Secondary | ICD-10-CM

## 2016-07-02 MED ORDER — LORAZEPAM 0.5 MG PO TABS: 0.5000 mg | ORAL_TABLET | Freq: Two times a day (BID) | ORAL | 1 refills | Status: DC

## 2016-07-02 NOTE — Telephone Encounter (Signed)
Daughter, Shirlean SchleinConticka Figley called and stated that patient runs out of his Lorazepam every 2 weeks because it is written for him to take twice daily and only given 30 tablets. Need a 90 day supply. Rx printed and faxed to pharmacy for 90 day supply. [facility]

## 2016-07-09 ENCOUNTER — Other Ambulatory Visit: Payer: Self-pay | Admitting: Internal Medicine

## 2016-07-09 DIAGNOSIS — R443 Hallucinations, unspecified: Secondary | ICD-10-CM

## 2016-07-17 ENCOUNTER — Encounter: Payer: Self-pay | Admitting: Internal Medicine

## 2016-07-17 ENCOUNTER — Other Ambulatory Visit: Payer: Medicare Other

## 2016-07-17 DIAGNOSIS — E782 Mixed hyperlipidemia: Secondary | ICD-10-CM

## 2016-07-17 LAB — LIPID PANEL
CHOLESTEROL: 128 mg/dL (ref <200)
HDL: 46 mg/dL (ref >40)
LDL Cholesterol: 61 mg/dL (ref <100)
Total CHOL/HDL Ratio: 2.8 Ratio (ref <5.0)
Triglycerides: 105 mg/dL (ref <150)
VLDL: 21 mg/dL (ref <30)

## 2016-07-17 LAB — ALT: ALT: 17 U/L (ref 9–46)

## 2016-07-19 ENCOUNTER — Encounter: Payer: Self-pay | Admitting: Internal Medicine

## 2016-07-30 MED ORDER — QUETIAPINE FUMARATE 25 MG PO TABS: 25.0000 mg | ORAL_TABLET | Freq: Three times a day (TID) | ORAL | 3 refills | Status: DC

## 2016-08-08 ENCOUNTER — Encounter: Payer: Self-pay | Admitting: Internal Medicine

## 2016-09-18 ENCOUNTER — Ambulatory Visit: Payer: Medicare Other | Admitting: Internal Medicine

## 2016-09-18 ENCOUNTER — Ambulatory Visit: Payer: Medicare Other

## 2016-09-19 ENCOUNTER — Ambulatory Visit (INDEPENDENT_AMBULATORY_CARE_PROVIDER_SITE_OTHER): Payer: Medicare Other | Admitting: Nurse Practitioner

## 2016-09-19 ENCOUNTER — Telehealth: Payer: Self-pay | Admitting: Nurse Practitioner

## 2016-09-19 ENCOUNTER — Ambulatory Visit: Payer: Medicare Other

## 2016-09-19 ENCOUNTER — Ambulatory Visit (INDEPENDENT_AMBULATORY_CARE_PROVIDER_SITE_OTHER): Payer: Medicare Other

## 2016-09-19 ENCOUNTER — Encounter: Payer: Self-pay | Admitting: Nurse Practitioner

## 2016-09-19 VITALS — BP 140/60 | HR 68 | Temp 98.1°F | Ht 71.0 in | Wt 189.0 lb

## 2016-09-19 VITALS — BP 140/60 | HR 68 | Temp 98.1°F | Resp 18 | Ht 71.0 in | Wt 189.0 lb

## 2016-09-19 DIAGNOSIS — E1142 Type 2 diabetes mellitus with diabetic polyneuropathy: Secondary | ICD-10-CM | POA: Diagnosis not present

## 2016-09-19 DIAGNOSIS — Z23 Encounter for immunization: Secondary | ICD-10-CM

## 2016-09-19 DIAGNOSIS — K219 Gastro-esophageal reflux disease without esophagitis: Secondary | ICD-10-CM

## 2016-09-19 DIAGNOSIS — E782 Mixed hyperlipidemia: Secondary | ICD-10-CM

## 2016-09-19 DIAGNOSIS — Z0000 Encounter for general adult medical examination without abnormal findings: Secondary | ICD-10-CM | POA: Diagnosis not present

## 2016-09-19 DIAGNOSIS — I10 Essential (primary) hypertension: Secondary | ICD-10-CM | POA: Diagnosis not present

## 2016-09-19 DIAGNOSIS — R443 Hallucinations, unspecified: Secondary | ICD-10-CM | POA: Diagnosis not present

## 2016-09-19 NOTE — Progress Notes (Signed)
Provider: Kirt Boys, DO  Patient Care Team: Kirt Boys, DO as PCP - General (Internal Medicine)  Extended Emergency Contact Information Primary Emergency Contact: Duffield,Shawanda Address: [address] [place]          [place], [place] [address] [place] Home Phone: [phone] Mobile Phone: [phone] Relation: Daughter Allergies  Allergen Reactions  . Bee Venom Anaphylaxis  . Cymbalta [Duloxetine Hcl]     Hallucinations   . Zoloft [Sertraline Hcl]     Hallucinations; incontinence    Code Status: DNR Goals of Care: Advanced Directive information Advanced Directives 09/19/2016  Does Patient Have a Medical Advance Directive? Yes  Type of Estate agent of [place];Living will  Does patient want to make changes to medical advance directive? No - Patient declined  Copy of Healthcare Power of Attorney in Chart? No - copy requested     Chief Complaint  Patient presents with  . Medical Management of Chronic Issues    Pt is being seen for an extended visit with MMSE and EKG.     HPI: Patient is a 83 y.o. male seen in today for an annual wellness exam and physical.  Here with grandson who does not know much of the patients hx, pts daughter is his normal caregiver.   DM - last A1c 6.0 which is goal. Not on any medication   HTN - stable on ACEI  GERD - stable on omeprazole  Hyperlipidemia - previously diet controlled; now on Lipitor. Tolerating medication without side effects.   No Major illnesses or hospitalization in the last year.     Depression screen [facility] 2/9 09/19/2016 06/12/2016  Decreased Interest 0 2  Down, Depressed, Hopeless 0 2  PHQ - 2 Score 0 4  reports anxiety but no depression  Fall Risk  09/19/2016 06/12/2016  Falls in the past year? Yes Yes  Number falls in past yr: 2 or more 1  Injury with Fall? No Yes   MMSE - Mini Mental State Exam 09/19/2016  Not completed: Unable to complete  Orientation to time 2    Orientation to Place 4  Registration 3  Attention/ Calculation 5  Recall 0  Language- name 2 objects 0  Language- repeat 1  Language- follow 3 step command 0  Language- read & follow direction 0  Write a sentence 0  Copy design 0  Total score 15     Health Maintenance  Topic Date Due  . FOOT EXAM  04/24/1940  . HEMOGLOBIN A1C  12/10/2016  . INFLUENZA VACCINE  01/08/2017  . OPHTHALMOLOGY EXAM  04/24/2017  . TETANUS/TDAP  06/10/2018  . PNA vac Low Risk Adult  Completed     Past Medical History:  Diagnosis Date  . Diabetes mellitus without complication (HCC)   . GERD (gastroesophageal reflux disease)   . GLA deficiency (HCC)   . Hypertension   . Macular dystrophy     Past Surgical History:  Procedure Laterality Date  . cataract Bilateral     Social History   Social History  . Marital status: Widowed    Spouse name: N/A  . Number of children: N/A  . Years of education: N/A   Social History Main Topics  . Smoking status: Former Smoker    Packs/day: 0.25    Years: 20.00  . Smokeless tobacco: Current User    Types: Chew  . Alcohol use No  . Drug use: No  . Sexual activity: Not Currently   Other Topics  Concern  . None   Social History Narrative  . None    Family History  Problem Relation Age of Onset  . Heart disease Father     Review of Systems:  Review of Systems  Constitutional: Positive for appetite change (increase) and unexpected weight change (gain). Negative for fatigue.  HENT: Positive for dental problem (wears dentures).        Nasal drainage  Eyes: Positive for pain and visual disturbance (reduced).       Legally blind  Respiratory: Negative for cough and shortness of breath.   Cardiovascular: Negative for chest pain, palpitations and leg swelling.  Gastrointestinal: Negative for constipation.       Hemorrhoids  Genitourinary: Positive for difficulty urinating (weak stream) and frequency.  Musculoskeletal: Positive for arthralgias,  back pain and gait problem.  Neurological: Positive for numbness. Negative for headaches.       Loss of balance;  Hematological: Bruises/bleeds easily.  Psychiatric/Behavioral: Positive for dysphoric mood, hallucinations (auditory; visual) and sleep disturbance. The patient is nervous/anxious.      Allergies as of 09/19/2016      Reactions   Bee Venom Anaphylaxis   Cymbalta [duloxetine Hcl]    Hallucinations    Zoloft [sertraline Hcl]    Hallucinations; incontinence       Medication List       Accurate as of 09/19/16  1:28 PM. Always use your most recent med list.          acetaminophen 325 MG tablet Commonly known as:  TYLENOL Take 650 mg by mouth every 6 (six) hours as needed.   atorvastatin 10 MG tablet Commonly known as:  LIPITOR Take 1 tablet (10 mg total) by mouth daily.   benazepril 10 MG tablet Commonly known as:  LOTENSIN Take 1 tablet (10 mg total) by mouth daily.   clotrimazole 1 % cream Commonly known as:  LOTRIMIN Apply 1 application topically 2 (two) times daily.   D3-1000 1000 units tablet Generic drug:  Cholecalciferol Take 1,000 Units by mouth daily.   HYDROcodone-acetaminophen 10-325 MG tablet Commonly known as:  NORCO Take 1 tablet by mouth every 6 (six) hours as needed.   LORazepam 0.5 MG tablet Commonly known as:  ATIVAN Take 1 tablet (0.5 mg total) by mouth 2 (two) times daily.   LUMIGAN 0.01 % Soln Generic drug:  bimatoprost Place 2 drops into both eyes 2 (two) times daily.   magnesium oxide 400 MG tablet Commonly known as:  MAG-OX Take 400 mg by mouth daily.   Omeprazole 20 MG Tbec Take 2 tablets (40 mg total) by mouth 2 (two) times daily before a meal.   PRESERVISION AREDS PO Take by mouth. Take 1 capsule in the morning and 1 capsule in the evening   psyllium 0.52 g capsule Commonly known as:  REGULOID Take 0.52 g by mouth daily.   QUEtiapine 25 MG tablet Commonly known as:  SEROQUEL Take 1 tablet (25 mg total) by mouth 3  (three) times daily.   zolpidem 5 MG tablet Commonly known as:  AMBIEN Take 1 tablet (5 mg total) by mouth at bedtime as needed for sleep.         Physical Exam: Vitals:   09/19/16 1317  BP: 140/60  Pulse: 68  Resp: 18  Temp: 98.1 F (36.7 C)  TempSrc: Oral  SpO2: 97%  Weight: 189 lb (85.7 kg)  Height:  (1.803 m)   Body mass index is 26.36 kg/m. Physical Exam  Constitutional:  He is oriented to person, place, and time. He appears well-developed.  Frail appearing in NAD  HENT:  Mouth/Throat: Oropharynx is clear and moist.  Eyes: Pupils are equal, round, and reactive to light. No scleral icterus.  Neck: Neck supple. Carotid bruit is not present. No thyromegaly present.  Cardiovascular: Normal rate, regular rhythm, normal heart sounds and intact distal pulses.  Exam reveals no gallop and no friction rub.   No murmur heard. no distal LE swelling. No calf TTP  Pulmonary/Chest: Effort normal and breath sounds normal. He has no wheezes. He has no rales. He exhibits no tenderness.  Abdominal: Soft. Bowel sounds are normal. He exhibits no distension, no abdominal bruit, no pulsatile midline mass and no mass. There is no hepatomegaly. There is no tenderness. There is no rebound and no guarding.  Lymphadenopathy:    He has no cervical adenopathy.  Neurological: He is alert and oriented to person, place, and time. He has normal reflexes.  Skin: Skin is warm and dry.  Right forearm tattoo  Psychiatric: He has a normal mood and affect. His behavior is normal. Thought content is delusional. Cognition and memory are impaired. He expresses no suicidal plans and no homicidal plans.    Labs reviewed: Basic Metabolic Panel:  Recent Labs  95/62/13 1000 06/12/16 1027  NA 138 140  K 4.3 4.3  CL 103 103  CO2 28 28  GLUCOSE 129* 139*  BUN 29* 38*  CREATININE 1.43* 1.37*  CALCIUM 9.5 9.5  TSH  --  2.11   Liver Function Tests:  Recent Labs  06/12/16 1027 07/17/16 0833    AST 20  --   ALT 12 17  ALKPHOS 114  --   BILITOT 0.8  --   PROT 6.4  --   ALBUMIN 3.7  --    No results for input(s): LIPASE, AMYLASE in the last 8760 hours. No results for input(s): AMMONIA in the last 8760 hours. CBC:  Recent Labs  05/20/16 1000  WBC 7.4  NEUTROABS 4.5  HGB 12.6*  HCT 37.5*  MCV 94.7  PLT 228   Lipid Panel:  Recent Labs  06/12/16 1027 07/17/16 0833  CHOL 211* 128  HDL 59 46  LDLCALC 129* 61  TRIG 115 105  CHOLHDL 3.6 2.8   Lab Results  Component Value Date   HGBA1C 6.0 (H) 06/12/2016    Procedures: No results found.  Assessment/Plan 1. Wellness examination -had awv today with nurse. the patient was counseled prevention of dental and periodontal disease, diet, regular sustained exercise for at least 30 minutes 5 times per week, smoking cessation, tobacco use,  and recommended schedule for GI hemoccult testing, colonoscopy, cholesterol, thyroid and diabetes screening. - DNR (Do Not Resuscitate)  2. Type 2 diabetes mellitus with diabetic polyneuropathy, without long-term current use of insulin (HCC) A1c at goal in January, diet controlled.   3. Essential hypertension Blood pressure stable, will cont benazepril 10 mg daily,  - CBC with Differential/Platelets  4. Gastroesophageal reflux disease without esophagitis Stable on omeprazole.   5. Mixed hyperlipidemia Improved on statin, no side effect noted. Will cont at this time.  - COMPLETE METABOLIC PANEL WITH GFR  6. Hallucinations -ongoing; conts on Seroquel 25 mg TID, grandson unsure if this has been beneficial. Will ask his mother and call if needed.    To follow up in 4 months with Dr Montez Morita, sooner If needed

## 2016-09-19 NOTE — Telephone Encounter (Signed)
I spoke with patient's daughter, Madie Reno, about any concerns she might have had about today's office visit.   Madie Reno stated that patient is taking seroquel  TID but he is still hearing voices and seeing people who are trying to harm him even though he is legally blind.   Is there another medication that can be tried? Please advise.

## 2016-09-19 NOTE — Telephone Encounter (Signed)
Did she have any concerns?

## 2016-09-19 NOTE — Progress Notes (Addendum)
Quick Notes   Health Maintenance: Pn13 given today. tDAP stated done by pt at [facility] less than 10 years ago. Foot exam due     Abnormal Screen: MMSE 15/22. Couldn't complete fully due to macular degeneration. Unable to pass clock drawing. Weight went from 166 lbs in January to 189 today.    Patient Concerns: More pain meds for neuropathy     Nurse Concerns: none

## 2016-09-19 NOTE — Telephone Encounter (Signed)
Brendan Spencer patient's daughter called per Jessica's request. Shanda Bumps can return call at [phone]

## 2016-09-19 NOTE — Progress Notes (Addendum)
Subjective:   Brendan Spencer is a 83 y.o. male who presents for an Initial Medicare Annual Wellness Visit.   Objective:    Today's Vitals   09/19/16 1233 09/19/16 1235  BP: 140/60   Pulse: 68   Temp: 98.1 F (36.7 C)   TempSrc: Oral   Weight: 189 lb (85.7 kg)   Height:  (1.803 m)   PainSc:  7    Body mass index is 26.36 kg/m.  Current Medications (verified) Outpatient Encounter Prescriptions as of 09/19/2016  Medication Sig  . acetaminophen (TYLENOL) 325 MG tablet Take 650 mg by mouth every 6 (six) hours as needed.  Marland Kitchen atorvastatin (LIPITOR) 10 MG tablet Take 1 tablet (10 mg total) by mouth daily.  . Cholecalciferol (D3-1000) 1000 units tablet Take 1,000 Units by mouth daily.  . clotrimazole (LOTRIMIN) 1 % cream Apply 1 application topically 2 (two) times daily.  Marland Kitchen HYDROcodone-acetaminophen (NORCO) 10-325 MG tablet Take 1 tablet by mouth every 6 (six) hours as needed.  Marland Kitchen LORazepam (ATIVAN) 0.5 MG tablet Take 1 tablet (0.5 mg total) by mouth 2 (two) times daily.  Marland Kitchen LUMIGAN 0.01 % SOLN Place 2 drops into both eyes 2 (two) times daily.  . magnesium oxide (MAG-OX) 400 MG tablet Take 400 mg by mouth daily.  . Multiple Vitamins-Minerals (PRESERVISION AREDS PO) Take by mouth. Take 1 capsule in the morning and 1 capsule in the evening  . Omeprazole 20 MG TBEC Take 2 tablets (40 mg total) by mouth 2 (two) times daily before a meal.  . psyllium (REGULOID) 0.52 g capsule Take 0.52 g by mouth daily.  . QUEtiapine (SEROQUEL) 25 MG tablet Take 1 tablet (25 mg total) by mouth 3 (three) times daily.  Marland Kitchen zolpidem (AMBIEN) 5 MG tablet Take 1 tablet (5 mg total) by mouth at bedtime as needed for sleep.  . benazepril (LOTENSIN) 10 MG tablet Take 1 tablet (10 mg total) by mouth daily.  . [DISCONTINUED] ketoconazole (NIZORAL) 200 MG tablet Take 1 tablet (200 mg total) by mouth daily.   No facility-administered encounter medications on file as of 09/19/2016.     Allergies (verified) Bee  venom; Cymbalta [duloxetine hcl]; and Zoloft [sertraline hcl]   History: Past Medical History:  Diagnosis Date  . Diabetes mellitus without complication (HCC)   . GERD (gastroesophageal reflux disease)   . GLA deficiency (HCC)   . Hypertension   . Macular dystrophy    Past Surgical History:  Procedure Laterality Date  . cataract Bilateral    Family History  Problem Relation Age of Onset  . Heart disease Father    Social History   Occupational History  . Not on file.   Social History Main Topics  . Smoking status: Former Smoker    Packs/day: 0.25    Years: 20.00  . Smokeless tobacco: Current User    Types: Chew  . Alcohol use No  . Drug use: No  . Sexual activity: Not Currently   Tobacco Counseling Ready to quit: Not Answered Counseling given: Not Answered   Activities of Daily Living In your present state of health, do you have any difficulty performing the following activities: 09/19/2016  Hearing? N  Vision? Y  Difficulty concentrating or making decisions? Y  Walking or climbing stairs? Y  Dressing or bathing? Y  Doing errands, shopping? Y  Preparing Food and eating ? N  Using the Toilet? N  In the past six months, have you accidently leaked urine? N  Do you  have problems with loss of bowel control? N  Managing your Medications? Y  Managing your Finances? Y  Housekeeping or managing your Housekeeping? Y    Immunizations and Health Maintenance Immunization History  Administered Date(s) Administered  . Influenza-Unspecified 02/09/2016  . Pneumococcal Conjugate-13 09/19/2016  . Pneumococcal-Unspecified 05/27/2012  . Zoster 06/10/2014   There are no preventive care reminders to display for this patient.  Patient Care Team: Kirt Boys, DO as PCP - General (Internal Medicine)  Indicate any recent Medical Services you may have received from other than [facility] providers in the past year (date may be approximate).    Assessment:   This is a routine  wellness examination for [place].   Hearing/Vision screen No exam data present  Dietary issues and exercise activities discussed: Current Exercise Habits: The patient does not participate in regular exercise at present, Exercise limited by: None identified  Goals    . Maintain Lifestyle          Starting 09/19/2016 I would like to maintain my lifestyle      Depression Screen PHQ 2/9 Scores 09/19/2016 06/12/2016  PHQ - 2 Score 0 4    Fall Risk Fall Risk  09/19/2016 06/12/2016  Falls in the past year? Yes Yes  Number falls in past yr: 2 or more 1  Injury with Fall? No Yes    Cognitive Function: MMSE - Mini Mental State Exam 09/19/2016  Not completed: Unable to complete  Orientation to time 2  Orientation to Place 4  Registration 3  Attention/ Calculation 5  Recall 0  Language- name 2 objects 0  Language- repeat 1  Language- follow 3 step command 0  Language- read & follow direction 0  Write a sentence 0  Copy design 0  Total score 15        Screening Tests Health Maintenance  Topic Date Due  . HEMOGLOBIN A1C  12/10/2016  . INFLUENZA VACCINE  01/08/2017  . OPHTHALMOLOGY EXAM  04/24/2017  . FOOT EXAM  09/19/2017  . TETANUS/TDAP  06/10/2018  . PNA vac Low Risk Adult  Completed        Plan:  I have personally reviewed and addressed the Medicare Annual Wellness questionnaire and have noted the following in the patient's chart:  A. Medical and social history B. Use of alcohol, tobacco or illicit drugs  C. Current medications and supplements D. Functional ability and status E.  Nutritional status F.  Physical activity G. Advance directives H. List of other physicians I.  Hospitalizations, surgeries, and ER visits in previous 12 months J.  Vitals K. Screenings to include hearing, vision, cognitive, depression L. Referrals and appointments - none  In addition, I have reviewed and discussed with patient certain preventive protocols, quality metrics, and best  practice recommendations. A written personalized care plan for preventive services as well as general preventive health recommendations were provided to patient.  See attached scanned questionnaire for additional information.   Signed,   Annetta Maw, RN Nurse Health Advisor   I reviewed health advisors note, was available for consultation and agree with documentation and plan.  Janene Harvey. Biagio Borg  [facility] [phone] 8 am - 5 pm) [phone] (after hours)

## 2016-09-19 NOTE — Patient Instructions (Addendum)
Mr. Brendan Spencer , Thank you for taking time to come for your Medicare Wellness Visit. I appreciate your ongoing commitment to your health goals. Please review the following plan we discussed and let me know if I can assist you in the future.   Screening recommendations/referrals: Colonoscopy up to date Recommended yearly ophthalmology/optometry visit for glaucoma screening and checkup Recommended yearly dental visit for hygiene and checkup  Vaccinations: Influenza vaccine up to date Pneumococcal vaccine up to date. Pn13 given.  Tdap vaccine up to date per pt. Due every 10 years. Shingles vaccine up to date.    Advanced directives: Advance directive discussed with you today. Stated you have it completed at home. Please bring a copy in to our office so we can scan it into your chart.   Conditions/risks identified: none  Next appointment: Brendan Spencer 4/12 @ 1:30pm  Preventive Care 65 Years and Older, Male Preventive care refers to lifestyle choices and visits with your health care provider that can promote health and wellness. What does preventive care include?  A yearly physical exam. This is also called an annual well check.  Dental exams once or twice a year.  Routine eye exams. Ask your health care provider how often you should have your eyes checked.  Personal lifestyle choices, including:  Daily care of your teeth and gums.  Regular physical activity.  Eating a healthy diet.  Avoiding tobacco and drug use.  Limiting alcohol use.  Practicing safe sex.  Taking low doses of aspirin every day.  Taking vitamin and mineral supplements as recommended by your health care provider. What happens during an annual well check? The services and screenings done by your health care provider during your annual well check will depend on your age, overall health, lifestyle risk factors, and family history of disease. Counseling  Your health care provider may ask you questions about  your:  Alcohol use.  Tobacco use.  Drug use.  Emotional well-being.  Home and relationship well-being.  Sexual activity.  Eating habits.  History of falls.  Memory and ability to understand (cognition).  Work and work Astronomer. Screening  You may have the following tests or measurements:  Height, weight, and BMI.  Blood pressure.  Lipid and cholesterol levels. These may be checked every 5 years, or more frequently if you are over 63 years old.  Skin check.  Lung cancer screening. You may have this screening every year starting at age 71 if you have a 30-pack-year history of smoking and currently smoke or have quit within the past 15 years.  Fecal occult blood test (FOBT) of the stool. You may have this test every year starting at age 39.  Flexible sigmoidoscopy or colonoscopy. You may have a sigmoidoscopy every 5 years or a colonoscopy every 10 years starting at age 72.  Prostate cancer screening. Recommendations will vary depending on your family history and other risks.  Hepatitis C blood test.  Hepatitis B blood test.  Sexually transmitted disease (STD) testing.  Diabetes screening. This is done by checking your blood sugar (glucose) after you have not eaten for a while (fasting). You may have this done every 1-3 years.  Abdominal aortic aneurysm (AAA) screening. You may need this if you are a current or former smoker.  Osteoporosis. You may be screened starting at age 46 if you are at high risk. Talk with your health care provider about your test results, treatment options, and if necessary, the need for more tests. Vaccines  Your health  care provider may recommend certain vaccines, such as:  Influenza vaccine. This is recommended every year.  Tetanus, diphtheria, and acellular pertussis (Tdap, Td) vaccine. You may need a Td booster every 10 years.  Zoster vaccine. You may need this after age 42.  Pneumococcal 13-valent conjugate (PCV13) vaccine.  One dose is recommended after age 70.  Pneumococcal polysaccharide (PPSV23) vaccine. One dose is recommended after age 87. Talk to your health care provider about which screenings and vaccines you need and how often you need them. This information is not intended to replace advice given to you by your health care provider. Make sure you discuss any questions you have with your health care provider. Document Released: 06/23/2015 Document Revised: 02/14/2016 Document Reviewed: 03/28/2015 Elsevier Interactive Patient Education  2017 [employer] Prevention in the Home Falls can cause injuries. They can happen to people of all ages. There are many things you can do to make your home safe and to help prevent falls. What can I do on the outside of my home?  Regularly fix the edges of walkways and driveways and fix any cracks.  Remove anything that might make you trip as you walk through a door, such as a raised step or threshold.  Trim any bushes or trees on the path to your home.  Use bright outdoor lighting.  Clear any walking paths of anything that might make someone trip, such as rocks or tools.  Regularly check to see if handrails are loose or broken. Make sure that both sides of any steps have handrails.  Any raised decks and porches should have guardrails on the edges.  Have any leaves, snow, or ice cleared regularly.  Use sand or salt on walking paths during winter.  Clean up any spills in your garage right away. This includes oil or grease spills. What can I do in the bathroom?  Use night lights.  Install grab bars by the toilet and in the tub and shower. Do not use towel bars as grab bars.  Use non-skid mats or decals in the tub or shower.  If you need to sit down in the shower, use a plastic, non-slip stool.  Keep the floor dry. Clean up any water that spills on the floor as soon as it happens.  Remove soap buildup in the tub or shower regularly.  Attach bath  mats securely with double-sided non-slip rug tape.  Do not have throw rugs and other things on the floor that can make you trip. What can I do in the bedroom?  Use night lights.  Make sure that you have a light by your bed that is easy to reach.  Do not use any sheets or blankets that are too big for your bed. They should not hang down onto the floor.  Have a firm chair that has side arms. You can use this for support while you get dressed.  Do not have throw rugs and other things on the floor that can make you trip. What can I do in the kitchen?  Clean up any spills right away.  Avoid walking on wet floors.  Keep items that you use a lot in easy-to-reach places.  If you need to reach something above you, use a strong step stool that has a grab bar.  Keep electrical cords out of the way.  Do not use floor polish or wax that makes floors slippery. If you must use wax, use non-skid floor wax.  Do not have  throw rugs and other things on the floor that can make you trip. What can I do with my stairs?  Do not leave any items on the stairs.  Make sure that there are handrails on both sides of the stairs and use them. Fix handrails that are broken or loose. Make sure that handrails are as long as the stairways.  Check any carpeting to make sure that it is firmly attached to the stairs. Fix any carpet that is loose or worn.  Avoid having throw rugs at the top or bottom of the stairs. If you do have throw rugs, attach them to the floor with carpet tape.  Make sure that you have a light switch at the top of the stairs and the bottom of the stairs. If you do not have them, ask someone to add them for you. What else can I do to help prevent falls?  Wear shoes that:  Do not have high heels.  Have rubber bottoms.  Are comfortable and fit you well.  Are closed at the toe. Do not wear sandals.  If you use a stepladder:  Make sure that it is fully opened. Do not climb a closed  stepladder.  Make sure that both sides of the stepladder are locked into place.  Ask someone to hold it for you, if possible.  Clearly mark and make sure that you can see:  Any grab bars or handrails.  First and last steps.  Where the edge of each step is.  Use tools that help you move around (mobility aids) if they are needed. These include:  Canes.  Walkers.  Scooters.  Crutches.  Turn on the lights when you go into a dark area. Replace any light bulbs as soon as they burn out.  Set up your furniture so you have a clear path. Avoid moving your furniture around.  If any of your floors are uneven, fix them.  If there are any pets around you, be aware of where they are.  Review your medicines with your doctor. Some medicines can make you feel dizzy. This can increase your chance of falling. Ask your doctor what other things that you can do to help prevent falls. This information is not intended to replace advice given to you by your health care provider. Make sure you discuss any questions you have with your health care provider. Document Released: 03/23/2009 Document Revised: 11/02/2015 Document Reviewed: 07/01/2014 Elsevier Interactive Patient Education  2017 [employer].

## 2016-09-20 MED ORDER — QUETIAPINE FUMARATE 50 MG PO TABS: 50.0000 mg | ORAL_TABLET | Freq: Three times a day (TID) | ORAL | 1 refills | Status: DC

## 2016-09-20 NOTE — Telephone Encounter (Signed)
Spoke with patient's daughter  Patient has benefited from Seroquel  Patients hallucinations are less  Patient is not having any side effects of Seroquel 25 mg, patient's daughter would like mediation increased and sent to [facility]  RX sent

## 2016-09-20 NOTE — Telephone Encounter (Signed)
Has patient benefited from Seroquel at all? Are the hallucinations less? Is he having any side effects noted from medication? Can try to increase medication to 50 mg three times a day but would not want to do this if he was having side effects or not seeing ANY benefit from the 25 mg three times daily

## 2016-09-26 ENCOUNTER — Other Ambulatory Visit: Payer: Medicare Other

## 2016-09-26 DIAGNOSIS — I10 Essential (primary) hypertension: Secondary | ICD-10-CM | POA: Diagnosis not present

## 2016-09-26 DIAGNOSIS — E782 Mixed hyperlipidemia: Secondary | ICD-10-CM | POA: Diagnosis not present

## 2016-09-26 LAB — COMPLETE METABOLIC PANEL WITH GFR
ALT: 13 U/L (ref 9–46)
AST: 24 U/L (ref 10–35)
Albumin: 3.6 g/dL (ref 3.6–5.1)
Alkaline Phosphatase: 133 U/L — ABNORMAL HIGH (ref 40–115)
BUN: 30 mg/dL — ABNORMAL HIGH (ref 7–25)
CO2: 29 mmol/L (ref 20–31)
Calcium: 9.1 mg/dL (ref 8.6–10.3)
Chloride: 104 mmol/L (ref 98–110)
Creat: 1.35 mg/dL — ABNORMAL HIGH (ref 0.70–1.11)
GFR, Est African American: 55 mL/min — ABNORMAL LOW (ref >=60)
GFR, Est Non African American: 48 mL/min — ABNORMAL LOW (ref >=60)
Glucose, Bld: 109 mg/dL — ABNORMAL HIGH (ref 65–99)
Potassium: 4.6 mmol/L (ref 3.5–5.3)
Sodium: 139 mmol/L (ref 135–146)
Total Bilirubin: 0.7 mg/dL (ref 0.2–1.2)
Total Protein: 6.3 g/dL (ref 6.1–8.1)

## 2016-09-26 LAB — CBC WITH DIFFERENTIAL/PLATELET
Basophils Absolute: 62 cells/uL (ref 0–200)
Basophils Relative: 1 %
Eosinophils Absolute: 806 cells/uL — ABNORMAL HIGH (ref 15–500)
Eosinophils Relative: 13 %
HCT: 39 % (ref 38.5–50.0)
Hemoglobin: 12.7 g/dL — ABNORMAL LOW (ref 13.2–17.1)
Lymphocytes Relative: 34 %
Lymphs Abs: 2108 cells/uL (ref 850–3900)
MCH: 32.2 pg (ref 27.0–33.0)
MCHC: 32.6 g/dL (ref 32.0–36.0)
MCV: 98.7 fL (ref 80.0–100.0)
MPV: 11.2 fL (ref 7.5–12.5)
Monocytes Absolute: 620 cells/uL (ref 200–950)
Monocytes Relative: 10 %
Neutro Abs: 2604 cells/uL (ref 1500–7800)
Neutrophils Relative %: 42 %
Platelets: 225 10*3/uL (ref 140–400)
RBC: 3.95 MIL/uL — ABNORMAL LOW (ref 4.20–5.80)
RDW: 13 % (ref 11.0–15.0)
WBC: 6.2 10*3/uL (ref 3.8–10.8)

## 2016-10-16 ENCOUNTER — Other Ambulatory Visit: Payer: Self-pay | Admitting: *Deleted

## 2016-10-16 DIAGNOSIS — K219 Gastro-esophageal reflux disease without esophagitis: Secondary | ICD-10-CM

## 2016-10-16 DIAGNOSIS — G4700 Insomnia, unspecified: Secondary | ICD-10-CM

## 2016-10-16 MED ORDER — OMEPRAZOLE 20 MG PO TBEC: 40.0000 mg | DELAYED_RELEASE_TABLET | Freq: Two times a day (BID) | ORAL | 3 refills | Status: DC

## 2016-10-16 MED ORDER — ZOLPIDEM TARTRATE 5 MG PO TABS: 5.0000 mg | ORAL_TABLET | Freq: Every evening | ORAL | 3 refills | Status: AC | PRN

## 2016-10-16 MED ORDER — QUETIAPINE FUMARATE 50 MG PO TABS: 50.0000 mg | ORAL_TABLET | Freq: Three times a day (TID) | ORAL | 1 refills | Status: AC

## 2016-10-16 NOTE — Telephone Encounter (Signed)
[facility]

## 2016-10-31 ENCOUNTER — Other Ambulatory Visit: Payer: Self-pay

## 2016-10-31 DIAGNOSIS — K219 Gastro-esophageal reflux disease without esophagitis: Secondary | ICD-10-CM

## 2016-10-31 MED ORDER — OMEPRAZOLE 20 MG PO TBEC: 40.0000 mg | DELAYED_RELEASE_TABLET | Freq: Two times a day (BID) | ORAL | 3 refills | Status: AC

## 2016-11-18 ENCOUNTER — Encounter: Payer: Self-pay | Admitting: Internal Medicine

## 2016-11-27 NOTE — Addendum Note (Signed)
Addended by: Kirt BoysARTER, Jermery Caratachea on: 11/27/2016 01:35 PM   Modules accepted: Level of Service

## 2016-12-15 ENCOUNTER — Encounter: Payer: Self-pay | Admitting: Internal Medicine

## 2016-12-15 DIAGNOSIS — F4329 Adjustment disorder with other symptoms: Secondary | ICD-10-CM

## 2016-12-16 ENCOUNTER — Other Ambulatory Visit: Payer: Self-pay | Admitting: Internal Medicine

## 2016-12-16 MED ORDER — LORAZEPAM 0.5 MG PO TABS: 0.5000 mg | ORAL_TABLET | Freq: Two times a day (BID) | ORAL | 0 refills | Status: DC

## 2016-12-23 ENCOUNTER — Encounter: Payer: Self-pay | Admitting: Internal Medicine

## 2016-12-24 ENCOUNTER — Telehealth: Payer: Self-pay

## 2016-12-24 NOTE — Telephone Encounter (Signed)
Per our records, he is only taking med BID. We cannot increase frequency without OV. Follow up as scheduled    Dr Montez Moritaarter    ----- Message -----  From: [name], [name], [name]  Sent: 12/24/2016 10:00 AM  To: [name], DO  Subject: FW: Non-Urgent Medical Question           Per pharmacy, this medication was just picked up on 12/23/16.    ----- Message -----  From: Brendan Mohairharles Spencer  Sent: 12/23/2016  7:34 PM  To: [facility]  Subject: Non-Urgent Medical Question             Dr. Montez Moritaarter,   I really hate bothering you but I need to know how many times a day my dad was suppose to be taking his lorazpam?..Marland Kitchen.Marland Kitchen.Unfortunately I may have been giving it to him wrong. I thought it read 3xs a day and I cannot find the bottle. He is out I have been in contact with [facility] and said they cannot refill because script says 2xs a day unless a new prescription is sent to them with the directions of 3xs a day. I know he needs it 3xs just because of how he is acting . He does have an appointment with you Aug.12th. I would appreciate any advice or help with this matter.    Thank you,  Brendan Spencer   I spoke with [name] to inform her of Dr. Celene Skeenarter's response and she verbalized understanding. She stated that she would speak with provider about it at upcoming appointment.

## 2017-01-18 ENCOUNTER — Other Ambulatory Visit: Payer: Self-pay | Admitting: Internal Medicine

## 2017-01-18 DIAGNOSIS — R443 Hallucinations, unspecified: Secondary | ICD-10-CM

## 2017-01-22 ENCOUNTER — Encounter: Payer: Self-pay | Admitting: Internal Medicine

## 2017-01-22 ENCOUNTER — Ambulatory Visit (INDEPENDENT_AMBULATORY_CARE_PROVIDER_SITE_OTHER): Payer: Medicare Other | Admitting: Internal Medicine

## 2017-01-22 VITALS — BP 120/76 | HR 79 | Temp 98.5°F | Ht 71.0 in | Wt 192.0 lb

## 2017-01-22 DIAGNOSIS — T50905A Adverse effect of unspecified drugs, medicaments and biological substances, initial encounter: Secondary | ICD-10-CM | POA: Diagnosis not present

## 2017-01-22 DIAGNOSIS — E782 Mixed hyperlipidemia: Secondary | ICD-10-CM

## 2017-01-22 DIAGNOSIS — D721 Eosinophilia: Secondary | ICD-10-CM | POA: Diagnosis not present

## 2017-01-22 DIAGNOSIS — R259 Unspecified abnormal involuntary movements: Secondary | ICD-10-CM | POA: Diagnosis not present

## 2017-01-22 DIAGNOSIS — M549 Dorsalgia, unspecified: Secondary | ICD-10-CM

## 2017-01-22 DIAGNOSIS — G8929 Other chronic pain: Secondary | ICD-10-CM | POA: Diagnosis not present

## 2017-01-22 DIAGNOSIS — R443 Hallucinations, unspecified: Secondary | ICD-10-CM | POA: Diagnosis not present

## 2017-01-22 DIAGNOSIS — I10 Essential (primary) hypertension: Secondary | ICD-10-CM

## 2017-01-22 DIAGNOSIS — Z79899 Other long term (current) drug therapy: Secondary | ICD-10-CM

## 2017-01-22 DIAGNOSIS — F4329 Adjustment disorder with other symptoms: Secondary | ICD-10-CM | POA: Diagnosis not present

## 2017-01-22 DIAGNOSIS — E1142 Type 2 diabetes mellitus with diabetic polyneuropathy: Secondary | ICD-10-CM

## 2017-01-22 LAB — CBC WITH DIFFERENTIAL/PLATELET
BASOS ABS: 0 {cells}/uL (ref 0–200)
Basophils Relative: 0 %
EOS PCT: 9 %
Eosinophils Absolute: 702 cells/uL — ABNORMAL HIGH (ref 15–500)
HCT: 41.2 % (ref 38.5–50.0)
HEMOGLOBIN: 13.7 g/dL (ref 13.2–17.1)
LYMPHS ABS: 1794 {cells}/uL (ref 850–3900)
Lymphocytes Relative: 23 %
MCH: 32.5 pg (ref 27.0–33.0)
MCHC: 33.3 g/dL (ref 32.0–36.0)
MCV: 97.6 fL (ref 80.0–100.0)
MPV: 10.5 fL (ref 7.5–12.5)
Monocytes Absolute: 702 cells/uL (ref 200–950)
Monocytes Relative: 9 %
NEUTROS PCT: 59 %
Neutro Abs: 4602 cells/uL (ref 1500–7800)
Platelets: 261 10*3/uL (ref 140–400)
RBC: 4.22 MIL/uL (ref 4.20–5.80)
RDW: 13.6 % (ref 11.0–15.0)
WBC: 7.8 10*3/uL (ref 3.8–10.8)

## 2017-01-22 LAB — TSH: TSH: 3.04 mIU/L (ref 0.40–4.50)

## 2017-01-22 MED ORDER — CARBIDOPA-LEVODOPA 25-250 MG PO TABS: 1.0000 | ORAL_TABLET | Freq: Three times a day (TID) | ORAL | 5 refills | Status: AC

## 2017-01-22 MED ORDER — LORAZEPAM 1 MG PO TABS: 1.0000 mg | ORAL_TABLET | Freq: Three times a day (TID) | ORAL | 1 refills | Status: AC

## 2017-01-22 MED ORDER — HYDROCODONE-ACETAMINOPHEN 10-325 MG PO TABS: 1.0000 | ORAL_TABLET | Freq: Four times a day (QID) | ORAL | 0 refills | Status: AC | PRN

## 2017-01-22 NOTE — Patient Instructions (Addendum)
Increase lorazepam to 1mg  3 times daily  START SINEMET for Parkinson tremors 3 times daily  Will call with neurology referral  Will call with lab results  Follow up in 1 month for hallucinations, tremor   Parkinson Disease Parkinson disease is a long-term (chronic) condition. It gets worse over time (is progressive). Parkinson disease limits your ability:  To control how your body moves.  To move your body normally.  This condition affects each person differently. The condition can range from mild to very bad. This condition tends to progress slowly over many years. Follow these instructions at home:  Take over-the-counter and prescription medicines only as told by your doctor.  Put grab bars and railings in your home. These help to prevent falls.  Follow instructions from your doctor about what you can or cannot eat or drink.  Go back to your normal activities as told by your doctor. Ask your doctor what activities are safe for you.  Exercise as told by your doctor or physical therapist.  Keep all follow-up visits as told by your doctor. This is important. These include any visits with a speech or occupational therapist.  Think about joining a support group for people who have Parkinson disease. Contact a doctor if:  Medicines do not help your symptoms.  You feel off-balance.  You fall at home.  You need more help at home.  You have: ? Trouble swallowing. ? A very hard time pooping (constipation). ? A lot of side effects from your medicines.  You see or hear things that are not real (hallucinate).  You feel: ? Confused. ? Anxious. ? Sad (depressed). Get help right away if:  You were hurt in a fall.  You cannot swallow without choking.  You have chest pain.  You have trouble breathing.  You do not feel safe at home. This information is not intended to replace advice given to you by your health care provider. Make sure you discuss any questions you  have with your health care provider. Document Released: 08/19/2011 Document Revised: 11/02/2015 Document Reviewed: 03/17/2015  Carbidopa; Levodopa tablets What is this medicine? CARBIDOPA;LEVODOPA (kar bi DOE pa; lee voe DOE pa) is used to treat the symptoms of Parkinson's disease. This medicine may be used for other purposes; ask your health care provider or pharmacist if you have questions. COMMON BRAND NAME(S): Atamet, SINEMET What should I tell my health care provider before I take this medicine? They need to know if you have any of these conditions: -asthma or lung disease -depression or other mental illness -diabetes -glaucoma -heart disease, including history of a heart attack -irregular heart beat -kidney or liver disease -melanoma or suspicious skin lesions -stomach or intestine ulcers -an unusual or allergic reaction to levodopa, carbidopa, other medicines, foods, dyes, or preservatives -pregnant or trying to get pregnant -breast-feeding How should I use this medicine? Take this medicine by mouth with a glass of water. Follow the directions on the prescription label. Take your doses at regular intervals. Do not take your medicine more often than directed. Do not stop taking except on the advice of your doctor or health care professional. Talk to your pediatrician regarding the use of this medicine in children. Special care may be needed. Overdosage: If you think you have taken too much of this medicine contact a poison control center or emergency room at once. NOTE: This medicine is only for you. Do not share this medicine with others. What if I miss a dose? If you  miss a dose, take it as soon as you can. If it is almost time for your next dose, take only that dose. Do not take double or extra doses. What may interact with this medicine? Do not take this medicine with any of the following medications: -MAOIs like Marplan, Nardil, and Parnate -reserpine -tetrabenazine This  medicine may also interact with the following medications: -alcohol -droperidol -entacapone -iron supplements or multivitamins with iron -isoniazid, INH -linezolid -medicines for depression, anxiety, or psychotic disturbances -medicines for high blood pressure -medicines for sleep -metoclopramide -papaverine -procarbazine -tedizolid -rasagiline -selegiline -tolcapone This list may not describe all possible interactions. Give your health care provider a list of all the medicines, herbs, non-prescription drugs, or dietary supplements you use. Also tell them if you smoke, drink alcohol, or use illegal drugs. Some items may interact with your medicine. What should I watch for while using this medicine? Visit your doctor or health care professional for regular checks on your progress. It may be several weeks or months before you feel the full benefits of this medicine. Continue to take your medicine on a regular schedule. Do not take any additional medicines for Parkinson's disease without first consulting with your health care provider. You may experience a wearing of effect prior to the time for your next dose of this medicine. You may also experience an on-off effect where the medicine apparently stops working for anything from a minute to several hours, then suddenly starts working again. Tell your doctor or health care professional if any of these symptoms happen to you. Your dose may need to be changed. A high protein diet can slow or prevent absorption of this medicine. Avoid high protein foods near the time of taking this medicine to help to prevent these problems. Take this medicine at least 30 minutes before eating or one hour after meals. You may want to eat higher protein foods later in the day or in small amounts. Discuss your diet with your doctor or health care professional or nutritionist. You may get drowsy or dizzy. Do not drive, use machinery, or do anything that needs mental  alertness until you know how this drug affects you. Do not stand or sit up quickly, especially if you are an older patient. This reduces the risk of dizzy or fainting spells. Alcohol can make you more drowsy and dizzy. Avoid alcoholic drinks. If you find that you have sudden feelings of wanting to sleep during normal activities, like cooking, watching television, or while driving or riding in a car, you should contact your health care professional. If you are diabetic, this medicine may interfere with the accuracy of some tests for sugar or ketones in the urine (does not interfere with blood tests). Check with your doctor or health care professional before changing the dose of your diabetic medicine. This medicine may discolor the urine or sweat, making it look darker or red in color. This is of no cause for concern. However, this may stain clothing or fabrics. There have been reports of increased sexual urges or other strong urges such as gambling while taking some medicines for Parkinson's disease. If you experience any of these urges while taking this medicine, you should report it to your health care provider as soon as possible. You should check your skin often for changes to moles and new growths while taking this medicine. Call your doctor if you notice any of these changes. What side effects may I notice from receiving this medicine? Side effects that  you should report to your doctor or health care professional as soon as possible: -allergic reactions like skin rash, itching or hives, swelling of the face, lips, or tongue -anxiety, confusion, or nervousness -falling asleep during normal activities like driving -fast, irregular heartbeat -hallucination, loss of contact with reality -mood changes like aggressive behavior, depression -stomach pain -trouble passing urine -uncontrolled movements of the mouth, head, hands, feet, shoulders, eyelids or other unusual muscle movements Side effects  that usually do not require medical attention (report to your doctor or health care professional if they continue or are bothersome): -headache -loss of appetite -muscle twitches -nausea, vomiting -nightmares, trouble sleeping -unusually weak or tired This list may not describe all possible side effects. Call your doctor for medical advice about side effects. You may report side effects to FDA at 1-800-FDA-1088. Where should I keep my medicine? Keep out of the reach of children. Store at room temperature between 15 and 30 degrees C (59 and 86 degrees F). Protect from light. Throw away any unused medicine after the expiration date. NOTE: This sheet is a summary. It may not cover all possible information. If you have questions about this medicine, talk to your doctor, pharmacist, or health care provider.  2018 Elsevier/Gold Standard (2013-07-27 15:41:53)  Elsevier Interactive Patient Education  [employer].

## 2017-01-22 NOTE — Progress Notes (Signed)
Patient ID: Brendan Spencer, male   DOB: 1930-05-04, 83 y.o.   MRN: [id number]    Location:  [facility] Place of Service: OFFICE  Chief Complaint  Patient presents with  . Medical Management of Chronic Issues    4 month follow-up, patient would like DM foot check and requesting pain pills for back pain. + Fall risk. Here with daughter Albina Billet and grandson Einar Pheasant    . Health Maintenance    A1c due   . Medication Refill    Not due for refills     HPI:  83 yo male seen today for f/u. Family c/a sporadic and paranoid behavior. Weight is up 2 lbs since last ov. Appetite is excellent. He does not exercise much 2/ pain in back and feet and lack of motivation. Sonia Baller, and daughter Albina Billet), present. Letter from family reviewed - pt continues to have visual/auditory hallucinations that he states are threatening; behavior is sporadic; family unable to find all of his hidden guns. He takes lorazepam BID which helps reduce a little of his anxiety. He still has a pistol in close proximity to him at home. Family has upcoming appt to get him a [place] provider.   He has a resting tremor with shuffling gait >1 yr. He reports it takes him "a long time" to get moving in the mornings. He has never seen a neurologist  He is a poor historian due to psych d/o. Hx obtained from chart and family  DM - diet controlled. A1c 6%.  No numbness/tingling. Urine microalbumin/Cr ratio 3. He takes an ACEI. LDL 61 and at goal on lipitor  HTN - stable on benazepril  GERD/hx Barretts'sesophagus - stable on omeprazole  Hyperlipidemia - stable on lipitor. LDL 61  Macular degeneration/glaucoma - legally blind in OU  Psychosis/adjustment d/o with anxiety/insomnia - mood/psychosis uncontrolled. He takes ativan 0.'5mg'$  BID, seroquel '50mg'$  TID. Abs eosinophil count 806 in 09/2016  Current tobacco use - chews tobacco. No plans to quit  Chronic back pain 2/2 DDD - stable on prn hydrocodone  Past Medical History:  Diagnosis Date    . Diabetes mellitus without complication ([place])   . GERD (gastroesophageal reflux disease)   . GLA deficiency ([place])   . Hypertension   . Macular dystrophy     Past Surgical History:  Procedure Laterality Date  . cataract Bilateral     Patient Care Team: Gildardo Cranker, DO as PCP - General (Internal Medicine)  Social History   Social History  . Marital status: Widowed    Spouse name: N/A  . Number of children: N/A  . Years of education: N/A   Occupational History  . Not on file.   Social History Main Topics  . Smoking status: Former Smoker    Packs/day: 0.25    Years: 20.00  . Smokeless tobacco: Current User    Types: Chew  . Alcohol use No  . Drug use: No  . Sexual activity: Not Currently   Other Topics Concern  . Not on file   Social History Narrative  . No narrative on file     reports that he has quit smoking. He has a 5.00 pack-year smoking history. His smokeless tobacco use includes Chew. He reports that he does not drink alcohol or use drugs.  Family History  Problem Relation Age of Onset  . Heart disease Father    Family Status  Relation Status  . Mother Deceased  . Father Deceased     Allergies  Allergen Reactions  .  Bee Venom Anaphylaxis  . Cymbalta [Duloxetine Hcl]     Hallucinations   . Zoloft [Sertraline Hcl]     Hallucinations; incontinence     Medications: Patient's Medications  New Prescriptions   No medications on file  Previous Medications   ACETAMINOPHEN (TYLENOL) 325 MG TABLET    Take 650 mg by mouth every 6 (six) hours as needed.   ATORVASTATIN (LIPITOR) 10 MG TABLET    Take 1 tablet (10 mg total) by mouth daily.   BENAZEPRIL (LOTENSIN) 10 MG TABLET    TAKE 1 TABLET DAILY   CHOLECALCIFEROL (D3-1000) 1000 UNITS TABLET    Take 1,000 Units by mouth daily.   CLOTRIMAZOLE (LOTRIMIN) 1 % CREAM    Apply 1 application topically 2 (two) times daily.   HYDROCODONE-ACETAMINOPHEN (NORCO) 10-325 MG TABLET    Take 1 tablet by mouth  every 6 (six) hours as needed.   LORAZEPAM (ATIVAN) 0.5 MG TABLET    Take 1 tablet (0.5 mg total) by mouth 2 (two) times daily.   LUMIGAN 0.01 % SOLN    Place 2 drops into both eyes 2 (two) times daily.   MAGNESIUM OXIDE (MAG-OX) 400 MG TABLET    Take 400 mg by mouth daily.   MULTIPLE VITAMINS-MINERALS (PRESERVISION AREDS PO)    Take by mouth. Take 1 capsule in the morning and 1 capsule in the evening   OMEPRAZOLE 20 MG TBEC    Take 2 tablets (40 mg total) by mouth 2 (two) times daily before a meal.   PSYLLIUM (REGULOID) 0.52 G CAPSULE    Take 0.52 g by mouth daily.   QUETIAPINE (SEROQUEL) 50 MG TABLET    Take 1 tablet (50 mg total) by mouth 3 (three) times daily.   ZOLPIDEM (AMBIEN) 5 MG TABLET    Take 1 tablet (5 mg total) by mouth at bedtime as needed for sleep.  Modified Medications   No medications on file  Discontinued Medications   No medications on file    Review of Systems  Unable to perform ROS: Psychiatric disorder (hallucinations/psychosis)    Vitals:   01/22/17 1022  BP: 120/76  Pulse: 79  Temp: 98.5 F (36.9 C)  TempSrc: Oral  SpO2: 98%  Weight: 192 lb (87.1 kg)  Height: '5\' 11"'$  (1.803 m)   Body mass index is 26.78 kg/m.  Physical Exam  Constitutional: He appears well-developed.  Frail appearing in NAD  HENT:  Mouth/Throat: Oropharynx is clear and moist.  MM dry; no oral lesions noted  Eyes: Pupils are equal, round, and reactive to light. No scleral icterus.  Neck: Neck supple. Carotid bruit is not present. No thyromegaly present.  Cardiovascular: Normal rate, regular rhythm and intact distal pulses.  Exam reveals no gallop and no friction rub.   Murmur (1/6 SEM) heard. Trace LE edema b/l. No calf TTP  Pulmonary/Chest: Effort normal and breath sounds normal. He has no wheezes. He has no rales. He exhibits no tenderness.  Abdominal: Soft. Bowel sounds are normal. He exhibits no distension, no abdominal bruit, no pulsatile midline mass and no mass. There is no  hepatomegaly. There is no tenderness. There is no rebound and no guarding.  Musculoskeletal: He exhibits edema and tenderness.  Lymphadenopathy:    He has no cervical adenopathy.  Neurological: He is alert. He displays tremor (resting). He exhibits abnormal muscle tone (cogwheel rigidity). Gait (shuffling gait) abnormal.  Skin: Skin is warm and dry. No rash noted.  Right forearm tattoo  Psychiatric: He has a  normal mood and affect. His behavior is normal. Thought content is delusional. Cognition and memory are impaired. He expresses no suicidal plans and no homicidal plans.     Labs reviewed: No visits with results within 3 Month(s) from this visit.  Latest known visit with results is:  Office Visit on 09/19/2016  Component Date Value Ref Range Status  . Sodium 09/26/2016 139  135 - 146 mmol/L Final  . Potassium 09/26/2016 4.6  3.5 - 5.3 mmol/L Final  . Chloride 09/26/2016 104  98 - 110 mmol/L Final  . CO2 09/26/2016 29  20 - 31 mmol/L Final  . Glucose, Bld 09/26/2016 109* 65 - 99 mg/dL Final  . BUN 09/26/2016 30* 7 - 25 mg/dL Final  . Creat 09/26/2016 1.35* 0.70 - 1.11 mg/dL Final   Comment:   For patients > or = 81 years of age: The upper reference limit for Creatinine is approximately 13% higher for people identified as African-American.     . Total Bilirubin 09/26/2016 0.7  0.2 - 1.2 mg/dL Final  . Alkaline Phosphatase 09/26/2016 133* 40 - 115 U/L Final  . AST 09/26/2016 24  10 - 35 U/L Final  . ALT 09/26/2016 13  9 - 46 U/L Final  . Total Protein 09/26/2016 6.3  6.1 - 8.1 g/dL Final  . Albumin 09/26/2016 3.6  3.6 - 5.1 g/dL Final  . Calcium 09/26/2016 9.1  8.6 - 10.3 mg/dL Final  . GFR, Est African American 09/26/2016 55* >=60 mL/min Final  . GFR, Est Non African American 09/26/2016 48* >=60 mL/min Final  . WBC 09/26/2016 6.2  3.8 - 10.8 K/uL Final  . RBC 09/26/2016 3.95* 4.20 - 5.80 MIL/uL Final  . Hemoglobin 09/26/2016 12.7* 13.2 - 17.1 g/dL Final  . HCT 09/26/2016  39.0  38.5 - 50.0 % Final  . MCV 09/26/2016 98.7  80.0 - 100.0 fL Final  . MCH 09/26/2016 32.2  27.0 - 33.0 pg Final  . MCHC 09/26/2016 32.6  32.0 - 36.0 g/dL Final  . RDW 09/26/2016 13.0  11.0 - 15.0 % Final  . Platelets 09/26/2016 225  140 - 400 K/uL Final  . MPV 09/26/2016 11.2  7.5 - 12.5 fL Final  . Neutro Abs 09/26/2016 2604  1,500 - 7,800 cells/uL Final  . Lymphs Abs 09/26/2016 2108  850 - 3,900 cells/uL Final  . Monocytes Absolute 09/26/2016 620  200 - 950 cells/uL Final  . Eosinophils Absolute 09/26/2016 806* 15 - 500 cells/uL Final  . Basophils Absolute 09/26/2016 62  0 - 200 cells/uL Final  . Neutrophils Relative % 09/26/2016 42  % Final  . Lymphocytes Relative 09/26/2016 34  % Final  . Monocytes Relative 09/26/2016 10  % Final  . Eosinophils Relative 09/26/2016 13  % Final  . Basophils Relative 09/26/2016 1  % Final  . Smear Review 09/26/2016 Criteria for review not met   Final    No results found.   Assessment/Plan   ICD-10-CM   1. Parkinsonian features R25.9 carbidopa-levodopa (SINEMET) 25-250 MG tablet    Ambulatory referral to Neurology  2. Hallucinations R44.3   3. Mixed emotional features as adjustment reaction F43.29 LORazepam (ATIVAN) 1 MG tablet  4. Type 2 diabetes mellitus with diabetic polyneuropathy, without long-term current use of insulin (HCC) E11.42 Hemoglobin A1c  5. Essential hypertension I10   6. Mixed hyperlipidemia E78.2   7. Chronic back pain, unspecified back location, unspecified back pain laterality M54.9 HYDROcodone-acetaminophen (NORCO) 10-325 MG tablet   G89.29  8. High risk medication use Z79.899 CMP with eGFR    CBC with Differential/Platelets    TSH  9. Drug-induced eosinophilia D72.1    T50.905A    at increased risk of DRESS 2/2 seroquel    Increase lorazepam to '1mg'$  3 times daily  START SINEMET for Parkinson tremors 3 times daily - educational handout given  Will call with neurology referral  Will call with lab  results  Follow up in 1 month for hallucinations, tremor  Susann Lawhorne S. Perlie Gold  [facility] [address] [place], [place] [address] [phone] Cell (Monday-Friday 8 AM - 5 PM) [phone] After 5 PM and follow prompts

## 2017-01-23 DIAGNOSIS — M549 Dorsalgia, unspecified: Secondary | ICD-10-CM

## 2017-01-23 DIAGNOSIS — G8929 Other chronic pain: Secondary | ICD-10-CM | POA: Insufficient documentation

## 2017-01-23 DIAGNOSIS — Z79899 Other long term (current) drug therapy: Secondary | ICD-10-CM | POA: Insufficient documentation

## 2017-01-23 DIAGNOSIS — R259 Unspecified abnormal involuntary movements: Secondary | ICD-10-CM | POA: Insufficient documentation

## 2017-01-23 DIAGNOSIS — H3530 Unspecified macular degeneration: Secondary | ICD-10-CM | POA: Insufficient documentation

## 2017-01-23 DIAGNOSIS — D721 Eosinophilia, unspecified: Secondary | ICD-10-CM | POA: Insufficient documentation

## 2017-01-23 DIAGNOSIS — IMO0002 Reserved for concepts with insufficient information to code with codable children: Secondary | ICD-10-CM | POA: Insufficient documentation

## 2017-01-23 DIAGNOSIS — T50905A Adverse effect of unspecified drugs, medicaments and biological substances, initial encounter: Secondary | ICD-10-CM | POA: Insufficient documentation

## 2017-01-23 LAB — COMPLETE METABOLIC PANEL WITH GFR
ALT: 10 U/L (ref 9–46)
AST: 20 U/L (ref 10–35)
Albumin: 4.1 g/dL (ref 3.6–5.1)
Alkaline Phosphatase: 150 U/L — ABNORMAL HIGH (ref 40–115)
BUN: 25 mg/dL (ref 7–25)
CALCIUM: 9.7 mg/dL (ref 8.6–10.3)
CHLORIDE: 100 mmol/L (ref 98–110)
CO2: 21 mmol/L (ref 20–32)
Creat: 1.39 mg/dL — ABNORMAL HIGH (ref 0.70–1.11)
GFR, Est African American: 53 mL/min — ABNORMAL LOW (ref >=60)
GFR, Est Non African American: 46 mL/min — ABNORMAL LOW (ref >=60)
GLUCOSE: 160 mg/dL — AB (ref 65–99)
POTASSIUM: 4.6 mmol/L (ref 3.5–5.3)
SODIUM: 139 mmol/L (ref 135–146)
Total Bilirubin: 0.8 mg/dL (ref 0.2–1.2)
Total Protein: 7.1 g/dL (ref 6.1–8.1)

## 2017-01-23 LAB — HEMOGLOBIN A1C
HEMOGLOBIN A1C: 6.1 % — AB (ref <5.7)
MEAN PLASMA GLUCOSE: 128 mg/dL

## 2017-01-27 ENCOUNTER — Encounter: Payer: Self-pay | Admitting: Internal Medicine

## 2017-02-18 ENCOUNTER — Encounter: Payer: Medicare Other | Admitting: Internal Medicine

## 2017-02-19 NOTE — Progress Notes (Signed)
This encounter was created in error - please disregard.

## 2017-03-23 DIAGNOSIS — N281 Cyst of kidney, acquired: Secondary | ICD-10-CM | POA: Diagnosis not present

## 2017-03-23 DIAGNOSIS — M4856XA Collapsed vertebra, not elsewhere classified, lumbar region, initial encounter for fracture: Secondary | ICD-10-CM | POA: Diagnosis not present

## 2017-03-23 DIAGNOSIS — W19XXXA Unspecified fall, initial encounter: Secondary | ICD-10-CM | POA: Diagnosis not present

## 2017-03-23 DIAGNOSIS — S22030A Wedge compression fracture of third thoracic vertebra, initial encounter for closed fracture: Secondary | ICD-10-CM | POA: Diagnosis not present

## 2017-03-23 DIAGNOSIS — Z7982 Long term (current) use of aspirin: Secondary | ICD-10-CM | POA: Diagnosis not present

## 2017-03-23 DIAGNOSIS — K8020 Calculus of gallbladder without cholecystitis without obstruction: Secondary | ICD-10-CM | POA: Diagnosis not present

## 2017-03-23 DIAGNOSIS — K5900 Constipation, unspecified: Secondary | ICD-10-CM | POA: Diagnosis not present

## 2017-03-23 DIAGNOSIS — M5136 Other intervertebral disc degeneration, lumbar region: Secondary | ICD-10-CM | POA: Diagnosis not present

## 2017-03-23 DIAGNOSIS — R1084 Generalized abdominal pain: Secondary | ICD-10-CM | POA: Diagnosis not present

## 2017-03-23 DIAGNOSIS — M47896 Other spondylosis, lumbar region: Secondary | ICD-10-CM | POA: Diagnosis not present

## 2017-03-23 DIAGNOSIS — Z91030 Bee allergy status: Secondary | ICD-10-CM | POA: Diagnosis not present

## 2017-03-23 DIAGNOSIS — Z87891 Personal history of nicotine dependence: Secondary | ICD-10-CM | POA: Diagnosis not present

## 2017-03-23 DIAGNOSIS — E119 Type 2 diabetes mellitus without complications: Secondary | ICD-10-CM | POA: Diagnosis not present

## 2017-03-23 DIAGNOSIS — M4854XA Collapsed vertebra, not elsewhere classified, thoracic region, initial encounter for fracture: Secondary | ICD-10-CM | POA: Diagnosis not present

## 2017-03-23 DIAGNOSIS — M545 Low back pain: Secondary | ICD-10-CM | POA: Diagnosis not present

## 2017-03-23 DIAGNOSIS — I10 Essential (primary) hypertension: Secondary | ICD-10-CM | POA: Diagnosis not present

## 2017-03-23 DIAGNOSIS — Z79899 Other long term (current) drug therapy: Secondary | ICD-10-CM | POA: Diagnosis not present

## 2017-03-23 DIAGNOSIS — N390 Urinary tract infection, site not specified: Secondary | ICD-10-CM | POA: Diagnosis not present

## 2017-03-23 NOTE — ED Provider Notes (Signed)
 [facility]  ED Provider Note  Connor Meacham 83 y.o. male DOB: 06-26-1929 MRN: [id number]  Medical screening initiated and orders placed by Leita DELENA Chancy, PA-C. 03/23/2017 / 4:02 PM  83 y.o. male presents with back pain (chronic) worse for the past 3 days. Difficulty urinating and moving bowels for the past 3 days. Right lower back pain. Urine dribbles out, no dysuria, no known history of prostate problems, had a UTI approx 8 months ago. No recent injuries, fell about 4 weeks ago when he missed a chair and fell onto buttocks. No abdominal pain, no groin numbness, no leg weakness.  Patient seen and received a screening examination in triage.  Appropriate orders have been initiated based on my brief physical exam and HPI. Patient placed in the sub-waiting area until a treatment room comes available for further evaluation and management in the main ED.  This medical screening exam was electronically signed by Leita DELENA Chancy, PA-C on 03/23/2017 at 4:02 PM     History   Chief Complaint  Patient presents with   Back Pain   Dysuria   83 yr old male with hx of chronic back pain presents with worsening back pain past 4-5 days, 9/10, right lower back. Fell 4 wks ago when he missed a chair but no fall since. He ambulates with cane. He c/o urinary frequency and dribbling past 3 days. No hematuria, but he is legally blind. No fever, cp, sob, abd pain, perianal numbness, bowel incontinence, numbness or weakness in lower extremities. No bowel movements x 3 days, but says this is his normal.     History provided by:  Patient and relative (Daughter) Language interpreter used: No     Past Medical History:  Diagnosis Date   Allergy    Apnea, sleep    DDD (degenerative disc disease)    Diabetes mellitus (*)    Glaucoma    Hyperlipidemia    Hypertension    Macular degeneration     Past Surgical History:  Procedure Laterality Date   Cataract  extraction w/ intraocular lens  implant, bilateral     Circumcision      History  Alcohol Use No   History  Smoking Status   Former Smoker  Smokeless Tobacco   Current User   History  Drug Use No        Allergies  Allergen Reactions   Bee     Home Medications   ASPIRIN 325 MG TABLET    Take 325 mg by mouth daily.     ATORVASTATIN  (LIPITOR) 10 MG TABLET    TAKE 1 TABLET BY MOUTH EVERY DAY   AZITHROMYCIN (AZASITE) 1 % OPHTHALMIC SOLUTION    Place 1 drop into both eyes 2 (two) times daily.     CHOLECALCIFEROL (VITAMIN D) 400 UNIT TABLET    Take 400 Units by mouth daily.     FEXOFENADINE (ALLEGRA) 180 MG TABLET    Take 180 mg by mouth daily.     GLIPIZIDE XL 5 MG 24 HR TABLET    TAKE ONE TABLET BY MOUTH EVERY DAY FOR DIABETES   HYDROCODONE -ACETAMINOPHEN  (NORCO) 5-325 MG PER TABLET    Take one tablet by mouth every 6 (six) hours as needed for Pain.   MAGNESIUM OXIDE (MAGNESIUM OXIDE) 400 TABS    Take 400 mg by mouth daily.   MOEXIPRIL -HYDROCHLOROTHIAZIDE 15-12.5 MG TABS    TAKE 1 TABLET BY MOUTH EVERY DAY   PIOGLITAZONE (ACTOS) 15 MG  TABLET    TAKE 1 TABLET BY MOUTH EVERY DAY   SERTRALINE (ZOLOFT) 100 MG TABLET    Take 100 mg by mouth daily.   ZOLPIDEM  (AMBIEN ) 10 MG TABLET    Take 0.5 tablets (5 mg total) by mouth 2 (two) times daily.   ZOLPIDEM  (AMBIEN ) 5 MG TABLET    Take 1 tablet (5 mg total) by mouth nightly as needed for Sleep. Take one tablet by mouth at bedtime as needed for sleep.    Review of Systems   Review of Systems  Constitutional: Negative.   Respiratory: Negative.   Cardiovascular: Negative.   Gastrointestinal: Negative.   Genitourinary: Positive for frequency.  Musculoskeletal: Positive for back pain.  Neurological: Negative.   All other systems reviewed and are negative.   Physical Exam   ED Triage Vitals [03/23/17 1603]  BP 143/73  Heart Rate 92  Resp 20  SpO2 97 %  Temp 98.3 F (36.8 C)    Physical Exam  Nursing note and vitals  reviewed. Constitutional: He appears well-nourished. He no respiratory distress.  Elderly. No acute distress.   HENT:  Head: Normocephalic and atraumatic.  Neck: Normal range of motion. Neck supple.  Cardiovascular: Normal rate and regular rhythm.  Pulmonary/Chest: No respiratory distress. Respiratory effort normal and breath sounds normal.  Abdominal: Soft. There is no tenderness. Abdomen not distended. Bowel sounds are normal.  Musculoskeletal:  Mild midline lumbar tenderness, no visible or palpable defect, and TTP over right lower back. 5/5 strength in bilateral lower extremities with normal distal neurovascular exam. Strong and equal dorsalis pedis pulses and good ROM in bilateral lower extremities.   Neurological: He is alert and oriented to person, place, and time. Moves all extremities equally. He has normal speech.  Skin: Skin is warm.  Psychiatric: He has a normal mood and affect.    ED Course   Lab results:    UA NEGATIVE POPULATION, NEG SYMPTOMS - Abnormal       Result Value   Urine Color Amber (*)    Urine Appearance Cloudy (*)    Urine Specific Gravity 1.028     Urine pH 6.0     Urine Protein - Dipstick Trace     Urine Glucose Negative     Urine Ketones Trace     Urine Bilirubin Small (*)    Comment:  Intensely colored urine samples may result in false positive reactions. Clinical evaluation of positive bilirubin results is recommended.    Urine Blood Negative     Urine Nitrite Negative     Urine Urobilinogen 1.0     Urine Leukocyte Esterase Moderate (*)    Urine Squamous Epithelial Cells 2-4     Urine WBC 10-20 (*)    Urine Bacteria 1+  (*)    Urine Mucous 2+  (*)    Urine Calcium  Oxalate Crystal 3+  (*)   CBC AND DIFFERENTIAL - Abnormal    WBC 8.2     RBC 4.01 (*)    HGB 13.0 (*)    HCT 38.8 (*)    MCV 97     MCH 32.4     MCHC 33.5     Plt Ct 239     RDW SD 49.1 (*)    MPV 9.8     NEUTROPHIL % 65.1     LYMPHOCYTE % 22.0 (*)    MONOCYTE % 11.2      Eosinophil % 1.5     BASOPHIL % 0.2  ABSOLUTE NEUTROPHIL COUNT 5.31     ABSOLUTE LYMPHOCYTE COUNT 1.8     MONO ABSOLUTE 0.9 (*)    EOS ABSOLUTE 0.1     BASO ABSOLUTE 0.0    COMPREHENSIVE METABOLIC PANEL - Abnormal    Na 140     Potassium 4.3     Cl 100     CO2 27     Glucose 158 (*)    BUN 22     Creatinine 1.27     Ca 9.7     ALK PHOS 140     T Bili 0.52     Total Protein 7.1     Alb 3.7     GLOBULIN 3.4     ALBUMIN/GLOBULIN RATIO 1.1     BUN/CREAT RATIO 17.3     ALT 10     AST 17     GFR AFRICAN AMERICAN 60     Comment:  African-American:  Normal GFR (glomerular filtration rate) > 60 mL/min/1.73 meters squared. < 60 may include impaired kidney function based on creatinine, age, gender, and race normalized to accepted average body surface area    GFR Non African American 52     Comment:  Non African American:  Normal GFR (glomerular filtration rate) > 60 mL/min/1.73 meters squared. < 60 may include impaired kidney function based on creatinine, age, gender, and race normalized to accepted average body surface area.    AGAP 13    LIGHT BLUE TOP  LAVENDER TOP  MINT GREEN-TOP TUBE (PST GEL/LI HEP)  GOLD SST    Imaging:   XR SPINE LUMBAR 2-3 VIEWS   Narrative:    INDICATION: Lower Back Pain   Exam date/time:  03/23/2017 4:16 PM TECHNIQUE:  XR SPINE LUMBAR 2-3 VIEWS   COMPARISON:  CT dated 08/14/2015   FINDINGS:  #  New compression fracture of the L3 vertebral body with approximately 50% height loss centrally. No significant bony retropulsion identified. #  Remote T12 compression fracture. #  Straightening of the normal lumbar lordosis.  #  Mild multilevel degenerative disc disease.  #  Moderate facet degenerative changes of the lower lumbar spine. #  Atherosclerosis.     Impression:    IMPRESSION: 1.  New L3 compression fracture relative to 2017. Correlate with site of pain to assess acuity. 2.  Remote T12 compression fracture.  3.  Osteopenia limits  evaluation.   Electronically Signed by: Emeline JINNY Poisson  CT ABDOMEN PELVIS WO IV CONTRAST (STONE PROTOCOL)   ECG: ECG Results   None     Pre-Sedation Procedures  ED Course as of Mar 23 1857  Almarie LABOR [facility]'s Documentation  Sun Mar 23, 2017  1842 Pt re-examined and findings discussed with daughter and pt at length. I discussed the importance of f/u for compression fracture and that I do not suspect cauda equina. Pt given rocephin  for UTI. Daughter is concerned for possible kidney stone, and says pt has hx, and would like CT stone for evaluation. I will get CT scan. Pt has been afebrile.    MDM Coding  New Prescriptions   CEPHALEXIN  (KEFLEX ) 500 MG CAPSULE    Take one capsule (500 mg total) by mouth 2 (two) times daily.      Quantity: 20 capsule    Refills: 0    Modified Medications   No medications on file    Discontinued Medications   No medications on file    Clinical Impression   Final diagnoses:  Urinary tract  infection without hematuria, site unspecified  Non-traumatic compression fracture of third thoracic vertebra, initial encounter (*)    ED Disposition    ED Disposition Comment   Discharge              Follow-up Information    [facility] Emergency Department.   Specialty:  Emergency Medicine Comments:  If symptoms worsen Contact information: [address] [place]  [address] [phone]       Schedule an appointment as soon as possible for a visit  with Odella GORMAN Pepper, DO.   Specialty:  Internal Medicine Contact information: [address] [place] [address] [phone]        Schedule an appointment as soon as possible for a visit  with Elsie JONELLE Daring, MD.   Specialties:  Neurosurgery, Neurological Surgery Contact information: [address] [place] [address] [phone]            Electronically signed by:   Almarie DELENA Brandy, PA 03/23/17 1846     Almarie DELENA Brandy, [place] 03/23/17 1859

## 2017-04-21 DIAGNOSIS — I10 Essential (primary) hypertension: Secondary | ICD-10-CM | POA: Diagnosis not present

## 2017-05-26 DIAGNOSIS — R4182 Altered mental status, unspecified: Secondary | ICD-10-CM | POA: Diagnosis not present

## 2017-05-26 DIAGNOSIS — E559 Vitamin D deficiency, unspecified: Secondary | ICD-10-CM | POA: Diagnosis not present

## 2017-05-26 DIAGNOSIS — E782 Mixed hyperlipidemia: Secondary | ICD-10-CM | POA: Diagnosis not present

## 2017-08-22 ENCOUNTER — Telehealth: Payer: Self-pay | Admitting: Internal Medicine

## 2017-08-22 NOTE — Telephone Encounter (Signed)
I called the patient's daughter, Brendan Spencer, to schedule AWV and follow up visit with Dr. Montez Moritaarter.  She stated that he now lives in a nursing home and is [facility].  So, he will not be seen here in our office anymore. VDM (DD)

## 2017-10-09 DIAGNOSIS — I10 Essential (primary) hypertension: Secondary | ICD-10-CM | POA: Diagnosis not present

## 2017-10-24 DIAGNOSIS — I10 Essential (primary) hypertension: Secondary | ICD-10-CM | POA: Diagnosis not present

## 2017-10-24 DIAGNOSIS — D649 Anemia, unspecified: Secondary | ICD-10-CM | POA: Diagnosis not present

## 2017-10-27 DIAGNOSIS — K219 Gastro-esophageal reflux disease without esophagitis: Secondary | ICD-10-CM | POA: Diagnosis not present

## 2017-10-27 DIAGNOSIS — E785 Hyperlipidemia, unspecified: Secondary | ICD-10-CM | POA: Diagnosis not present

## 2017-10-27 DIAGNOSIS — I10 Essential (primary) hypertension: Secondary | ICD-10-CM | POA: Diagnosis not present

## 2017-10-27 DIAGNOSIS — G20 Parkinson's disease: Secondary | ICD-10-CM | POA: Diagnosis not present

## 2017-10-27 DIAGNOSIS — F0390 Unspecified dementia without behavioral disturbance: Secondary | ICD-10-CM | POA: Diagnosis not present

## 2017-11-23 IMAGING — CT CT HEAD W/O CM
4 series · 17 of 47 positions shown, 19 images · non-contrast
Comparison: 04/07/2014

CLINICAL DATA: Altered mental status. Increasing confusion and
hallucinations.

EXAM:
CT HEAD WITHOUT CONTRAST
TECHNIQUE: Contiguous axial images were obtained from the base of the skull
through the vertex without intravenous contrast.

[Series 2: head without · axial · non-contrast · 0.45mm/px · z∈[-51,+69]mm · 7 of 34 slices shown, 9 images]
[im 5/34  brain]
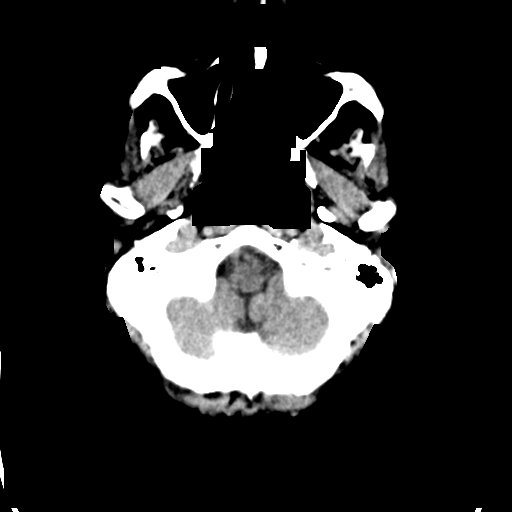
[im 5/34  bone]
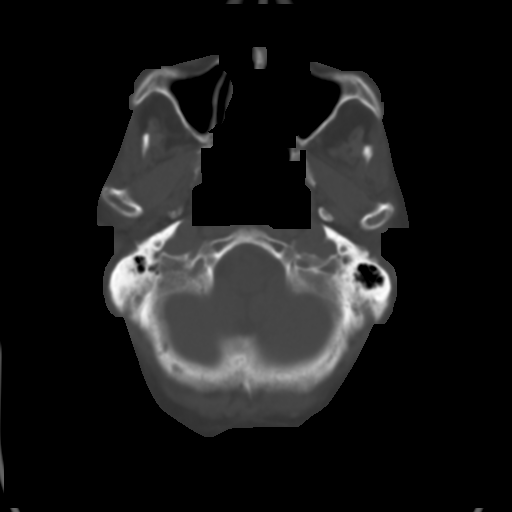
[im 9/34  brain]
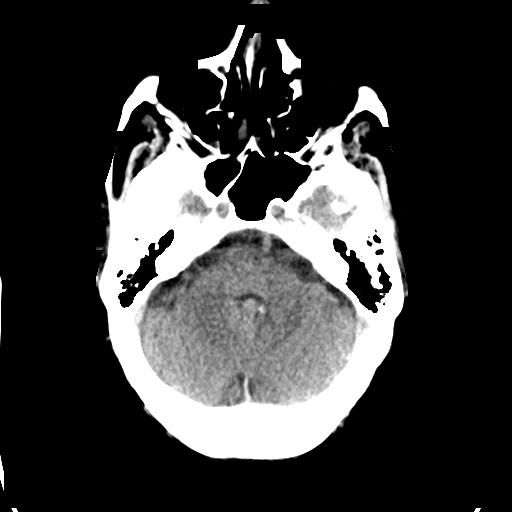
[im 13/34  brain]
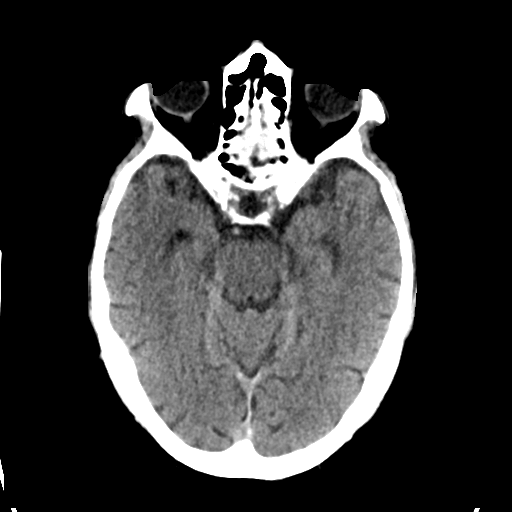
[im 17/34  brain]
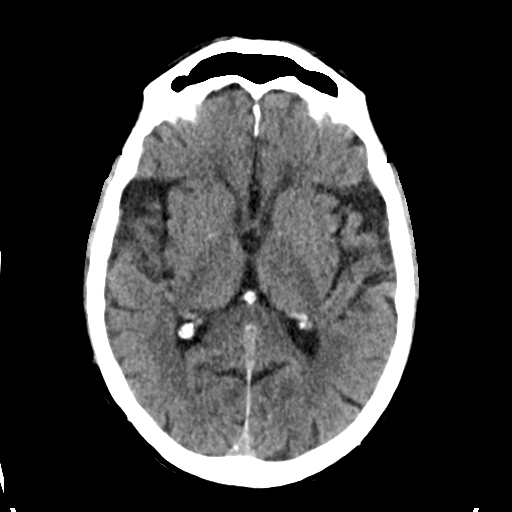
[im 21/34  brain]
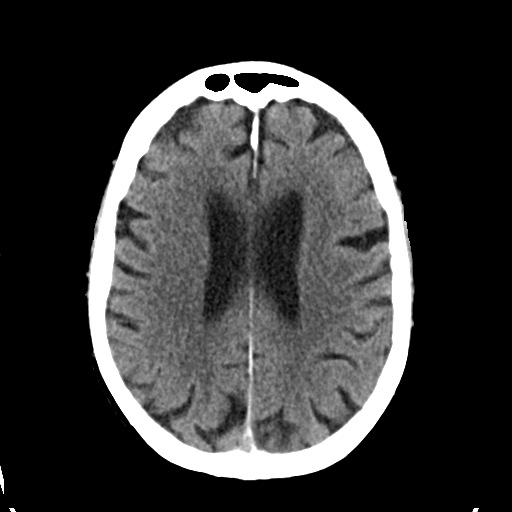
[im 21/34  bone]
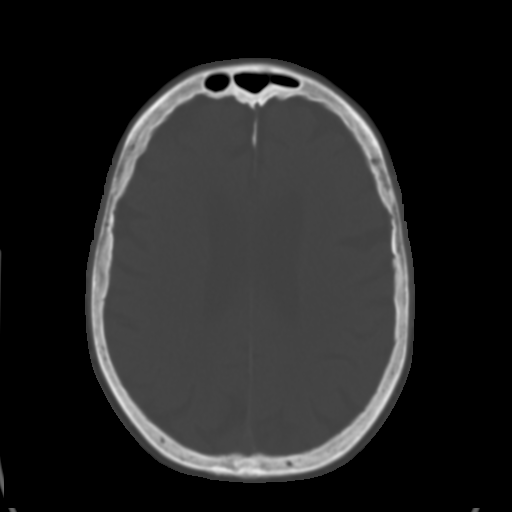
[im 25/34  brain]
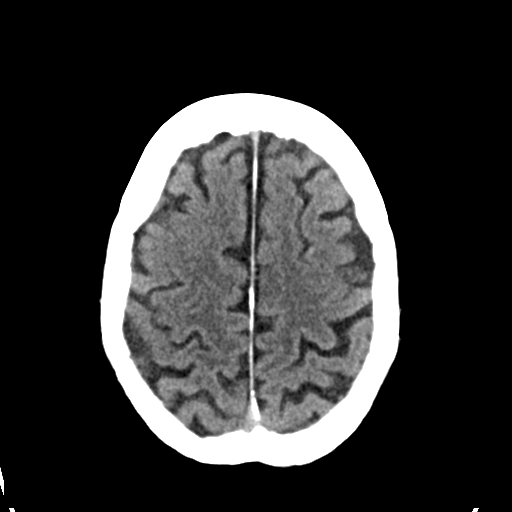
[im 29/34  brain]
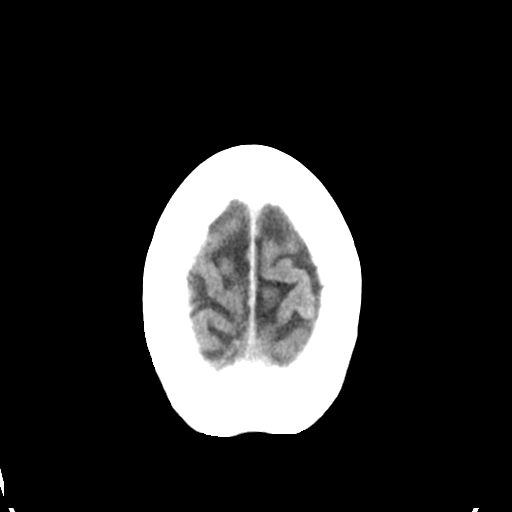

[Series 3: head bone · axial · 0.45mm/px · z∈[-55,+3]mm · 4 of 84 slices shown]
[im 9/84  bone]
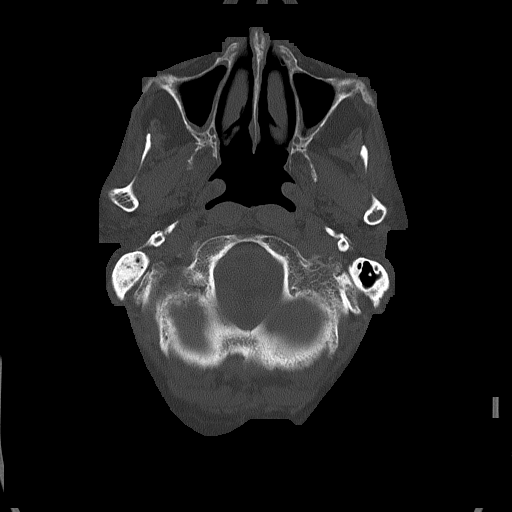
[im 17/84  bone]
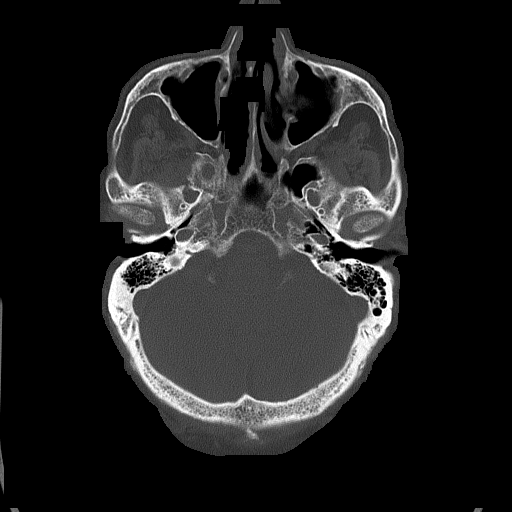
[im 25/84  bone]
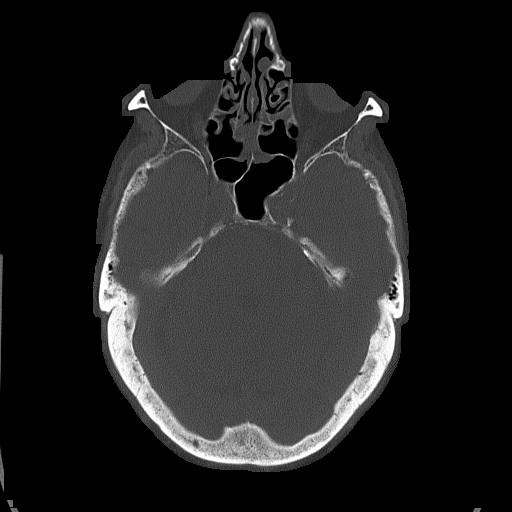
[im 38/84  bone]
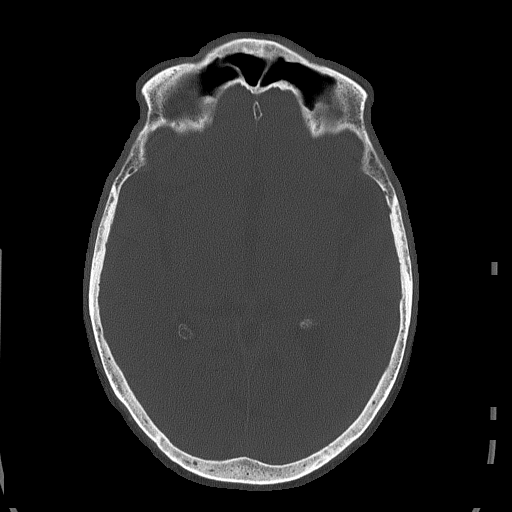

[Series 4: head without cor · coronal · non-contrast · 0.29mm/px · 3 of 80 slices shown]
[im 27/80  brain]
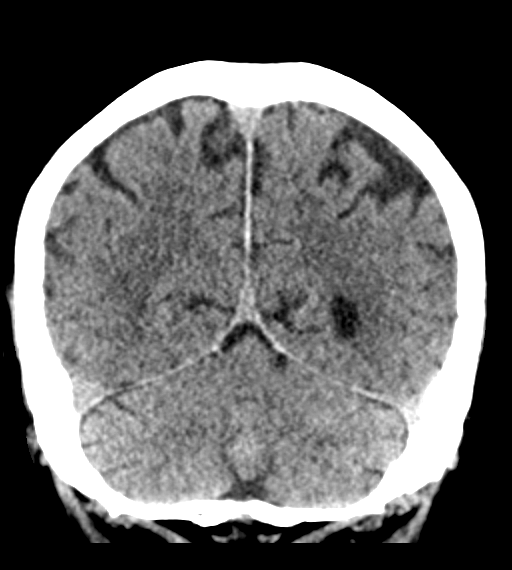
[im 36/80  brain]
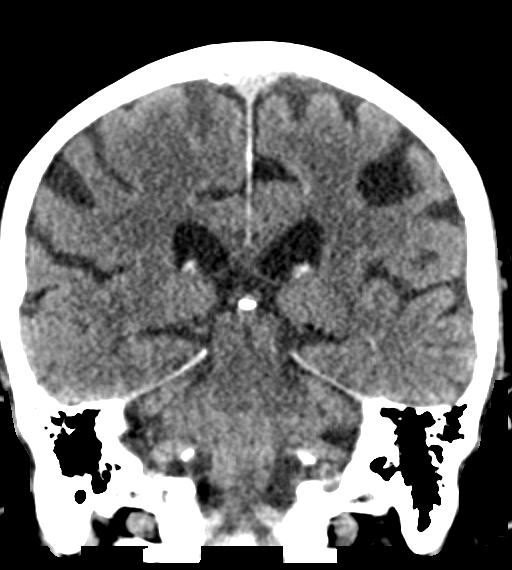
[im 44/80  brain]
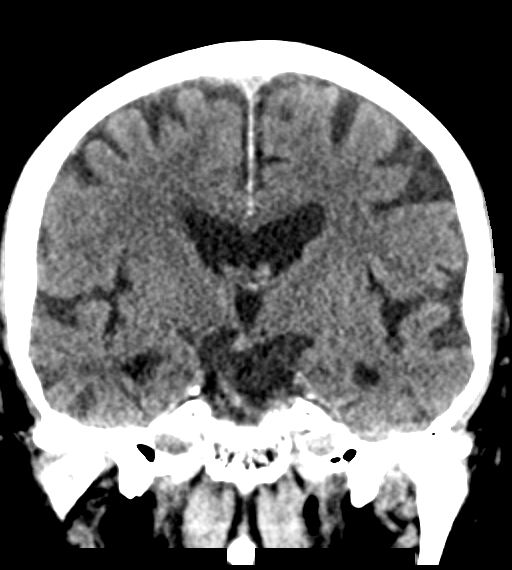

[Series 5: head without sag · sagittal · non-contrast · 0.37mm/px · 3 of 58 slices shown]
[im 20/58  brain]
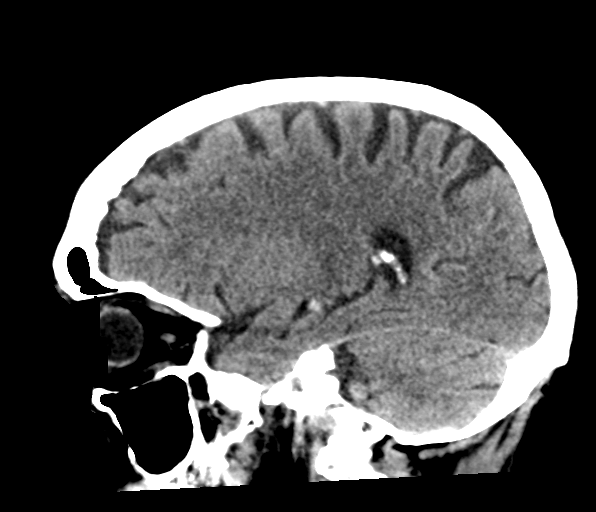
[im 29/58  brain]
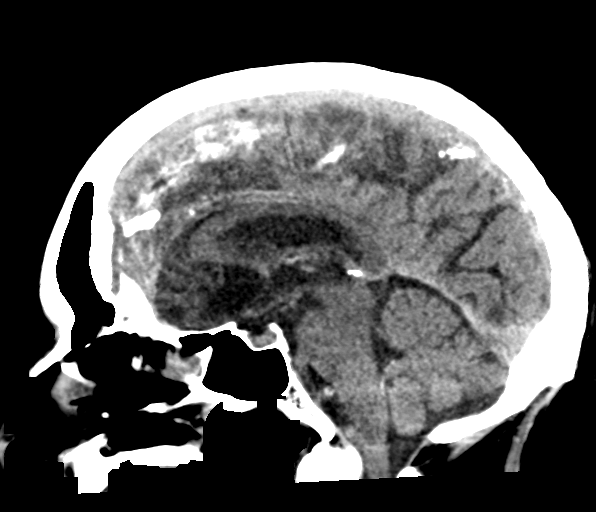
[im 39/58  brain]
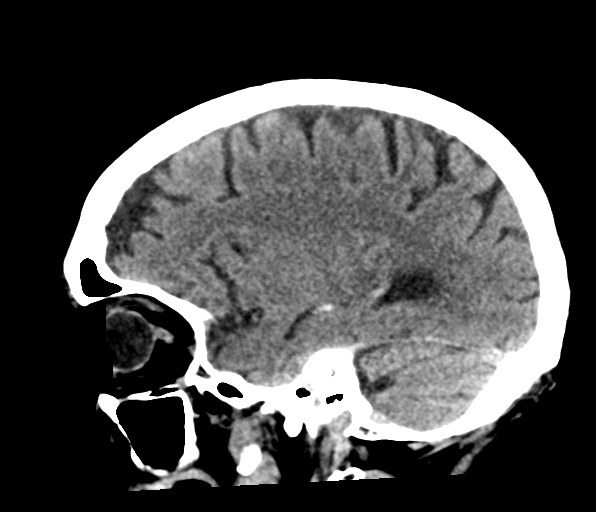

[17 of 47 positions shown; findings below may reference images not displayed]

FINDINGS: Brain: There is no evidence of acute cortical infarct, intracranial
hemorrhage, mass, midline shift, or extra-axial fluid collection.
There is mild generalized cerebral atrophy, unchanged.
Periventricular white matter hypodensities are nonspecific but
compatible with mild chronic small vessel ischemic disease.

Vascular: Calcified atherosclerosis at the skullbase. No hyperdense
vessel.

Skull: No fracture or focal osseous lesion.

Sinuses/Orbits: The mild paranasal sinus mucosal thickening. Clear
mastoid air cells. Prior bilateral cataract extraction.

Other: None.
IMPRESSION: 1. No evidence of acute intracranial abnormality.
2. Mild chronic small vessel ischemic disease.

## 2017-12-01 DIAGNOSIS — F0390 Unspecified dementia without behavioral disturbance: Secondary | ICD-10-CM | POA: Diagnosis not present

## 2017-12-01 DIAGNOSIS — G20 Parkinson's disease: Secondary | ICD-10-CM | POA: Diagnosis not present

## 2017-12-01 DIAGNOSIS — F0391 Unspecified dementia with behavioral disturbance: Secondary | ICD-10-CM | POA: Diagnosis not present

## 2017-12-01 DIAGNOSIS — F419 Anxiety disorder, unspecified: Secondary | ICD-10-CM | POA: Diagnosis not present

## 2017-12-24 DIAGNOSIS — I10 Essential (primary) hypertension: Secondary | ICD-10-CM | POA: Diagnosis not present

## 2017-12-24 DIAGNOSIS — E785 Hyperlipidemia, unspecified: Secondary | ICD-10-CM | POA: Diagnosis not present

## 2017-12-24 DIAGNOSIS — G20 Parkinson's disease: Secondary | ICD-10-CM | POA: Diagnosis not present

## 2017-12-24 DIAGNOSIS — F0391 Unspecified dementia with behavioral disturbance: Secondary | ICD-10-CM | POA: Diagnosis not present

## 2018-01-19 DIAGNOSIS — I10 Essential (primary) hypertension: Secondary | ICD-10-CM | POA: Diagnosis not present

## 2018-01-19 DIAGNOSIS — E785 Hyperlipidemia, unspecified: Secondary | ICD-10-CM | POA: Diagnosis not present

## 2018-01-19 DIAGNOSIS — G20 Parkinson's disease: Secondary | ICD-10-CM | POA: Diagnosis not present

## 2018-01-19 DIAGNOSIS — F0390 Unspecified dementia without behavioral disturbance: Secondary | ICD-10-CM | POA: Diagnosis not present

## 2018-03-27 DIAGNOSIS — K219 Gastro-esophageal reflux disease without esophagitis: Secondary | ICD-10-CM | POA: Diagnosis not present

## 2018-03-27 DIAGNOSIS — F0390 Unspecified dementia without behavioral disturbance: Secondary | ICD-10-CM | POA: Diagnosis not present

## 2018-03-27 DIAGNOSIS — E785 Hyperlipidemia, unspecified: Secondary | ICD-10-CM | POA: Diagnosis not present

## 2018-03-27 DIAGNOSIS — G20 Parkinson's disease: Secondary | ICD-10-CM | POA: Diagnosis not present

## 2018-05-15 DIAGNOSIS — G20 Parkinson's disease: Secondary | ICD-10-CM | POA: Diagnosis not present

## 2018-05-15 DIAGNOSIS — E785 Hyperlipidemia, unspecified: Secondary | ICD-10-CM | POA: Diagnosis not present

## 2018-05-15 DIAGNOSIS — F0390 Unspecified dementia without behavioral disturbance: Secondary | ICD-10-CM | POA: Diagnosis not present

## 2018-05-15 DIAGNOSIS — E46 Unspecified protein-calorie malnutrition: Secondary | ICD-10-CM | POA: Diagnosis not present

## 2018-07-17 DIAGNOSIS — K5900 Constipation, unspecified: Secondary | ICD-10-CM | POA: Diagnosis not present

## 2018-07-17 DIAGNOSIS — R109 Unspecified abdominal pain: Secondary | ICD-10-CM | POA: Diagnosis not present

## 2018-07-22 DIAGNOSIS — K219 Gastro-esophageal reflux disease without esophagitis: Secondary | ICD-10-CM | POA: Diagnosis not present

## 2018-08-03 DIAGNOSIS — E785 Hyperlipidemia, unspecified: Secondary | ICD-10-CM | POA: Diagnosis not present

## 2018-08-03 DIAGNOSIS — K219 Gastro-esophageal reflux disease without esophagitis: Secondary | ICD-10-CM | POA: Diagnosis not present

## 2018-08-03 DIAGNOSIS — G20 Parkinson's disease: Secondary | ICD-10-CM | POA: Diagnosis not present

## 2018-08-03 DIAGNOSIS — F0281 Dementia in other diseases classified elsewhere with behavioral disturbance: Secondary | ICD-10-CM | POA: Diagnosis not present

## 2018-09-18 DIAGNOSIS — K210 Gastro-esophageal reflux disease with esophagitis: Secondary | ICD-10-CM | POA: Diagnosis not present

## 2018-09-18 DIAGNOSIS — G20 Parkinson's disease: Secondary | ICD-10-CM | POA: Diagnosis not present

## 2018-09-18 DIAGNOSIS — E785 Hyperlipidemia, unspecified: Secondary | ICD-10-CM | POA: Diagnosis not present

## 2018-09-18 DIAGNOSIS — F0391 Unspecified dementia with behavioral disturbance: Secondary | ICD-10-CM | POA: Diagnosis not present

## 2018-11-25 DIAGNOSIS — F0281 Dementia in other diseases classified elsewhere with behavioral disturbance: Secondary | ICD-10-CM | POA: Diagnosis not present

## 2018-11-25 DIAGNOSIS — H543 Unqualified visual loss, both eyes: Secondary | ICD-10-CM | POA: Diagnosis not present

## 2018-11-25 DIAGNOSIS — G20 Parkinson's disease: Secondary | ICD-10-CM | POA: Diagnosis not present

## 2018-11-25 DIAGNOSIS — K210 Gastro-esophageal reflux disease with esophagitis: Secondary | ICD-10-CM | POA: Diagnosis not present

## 2019-01-06 DIAGNOSIS — B342 Coronavirus infection, unspecified: Secondary | ICD-10-CM | POA: Diagnosis not present

## 2019-01-11 ENCOUNTER — Other Ambulatory Visit: Payer: Self-pay

## 2019-01-12 DIAGNOSIS — E559 Vitamin D deficiency, unspecified: Secondary | ICD-10-CM | POA: Diagnosis not present

## 2019-01-12 DIAGNOSIS — K219 Gastro-esophageal reflux disease without esophagitis: Secondary | ICD-10-CM | POA: Diagnosis not present

## 2019-01-12 DIAGNOSIS — E785 Hyperlipidemia, unspecified: Secondary | ICD-10-CM | POA: Diagnosis not present

## 2019-01-12 DIAGNOSIS — G20 Parkinson's disease: Secondary | ICD-10-CM | POA: Diagnosis not present

## 2019-01-12 DIAGNOSIS — F0391 Unspecified dementia with behavioral disturbance: Secondary | ICD-10-CM | POA: Diagnosis not present

## 2019-01-12 DIAGNOSIS — N189 Chronic kidney disease, unspecified: Secondary | ICD-10-CM | POA: Diagnosis not present

## 2019-03-01 DIAGNOSIS — S51019A Laceration without foreign body of unspecified elbow, initial encounter: Secondary | ICD-10-CM | POA: Diagnosis not present

## 2019-03-01 DIAGNOSIS — F0391 Unspecified dementia with behavioral disturbance: Secondary | ICD-10-CM | POA: Diagnosis not present

## 2019-03-01 DIAGNOSIS — L309 Dermatitis, unspecified: Secondary | ICD-10-CM | POA: Diagnosis not present

## 2019-04-05 DIAGNOSIS — K5900 Constipation, unspecified: Secondary | ICD-10-CM | POA: Diagnosis not present

## 2019-04-05 DIAGNOSIS — F0281 Dementia in other diseases classified elsewhere with behavioral disturbance: Secondary | ICD-10-CM | POA: Diagnosis not present

## 2019-04-05 DIAGNOSIS — G20 Parkinson's disease: Secondary | ICD-10-CM | POA: Diagnosis not present

## 2019-04-05 DIAGNOSIS — K219 Gastro-esophageal reflux disease without esophagitis: Secondary | ICD-10-CM | POA: Diagnosis not present

## 2019-05-14 DIAGNOSIS — E612 Magnesium deficiency: Secondary | ICD-10-CM | POA: Diagnosis not present

## 2019-05-14 DIAGNOSIS — E785 Hyperlipidemia, unspecified: Secondary | ICD-10-CM | POA: Diagnosis not present

## 2019-05-14 DIAGNOSIS — K5909 Other constipation: Secondary | ICD-10-CM | POA: Diagnosis not present

## 2019-05-14 DIAGNOSIS — G20 Parkinson's disease: Secondary | ICD-10-CM | POA: Diagnosis not present

## 2019-05-14 DIAGNOSIS — K2100 Gastro-esophageal reflux disease with esophagitis, without bleeding: Secondary | ICD-10-CM | POA: Diagnosis not present

## 2019-05-14 DIAGNOSIS — F0391 Unspecified dementia with behavioral disturbance: Secondary | ICD-10-CM | POA: Diagnosis not present

## 2019-05-14 DIAGNOSIS — E559 Vitamin D deficiency, unspecified: Secondary | ICD-10-CM | POA: Diagnosis not present

## 2019-07-06 DIAGNOSIS — Z23 Encounter for immunization: Secondary | ICD-10-CM | POA: Diagnosis not present

## 2019-07-30 DIAGNOSIS — F0281 Dementia in other diseases classified elsewhere with behavioral disturbance: Secondary | ICD-10-CM | POA: Diagnosis not present

## 2019-07-30 DIAGNOSIS — K5904 Chronic idiopathic constipation: Secondary | ICD-10-CM | POA: Diagnosis not present

## 2019-07-30 DIAGNOSIS — G20 Parkinson's disease: Secondary | ICD-10-CM | POA: Diagnosis not present

## 2019-07-30 DIAGNOSIS — K219 Gastro-esophageal reflux disease without esophagitis: Secondary | ICD-10-CM | POA: Diagnosis not present

## 2019-08-03 DIAGNOSIS — Z23 Encounter for immunization: Secondary | ICD-10-CM | POA: Diagnosis not present

## 2019-09-10 DIAGNOSIS — N189 Chronic kidney disease, unspecified: Secondary | ICD-10-CM | POA: Diagnosis not present

## 2019-09-10 DIAGNOSIS — G20 Parkinson's disease: Secondary | ICD-10-CM | POA: Diagnosis not present

## 2019-09-10 DIAGNOSIS — K5909 Other constipation: Secondary | ICD-10-CM | POA: Diagnosis not present

## 2019-09-10 DIAGNOSIS — K2100 Gastro-esophageal reflux disease with esophagitis, without bleeding: Secondary | ICD-10-CM | POA: Diagnosis not present

## 2019-09-10 DIAGNOSIS — E785 Hyperlipidemia, unspecified: Secondary | ICD-10-CM | POA: Diagnosis not present

## 2019-09-10 DIAGNOSIS — E46 Unspecified protein-calorie malnutrition: Secondary | ICD-10-CM | POA: Diagnosis not present

## 2019-09-10 DIAGNOSIS — F0391 Unspecified dementia with behavioral disturbance: Secondary | ICD-10-CM | POA: Diagnosis not present

## 2020-01-26 DIAGNOSIS — N189 Chronic kidney disease, unspecified: Secondary | ICD-10-CM | POA: Diagnosis not present

## 2020-01-26 DIAGNOSIS — D649 Anemia, unspecified: Secondary | ICD-10-CM | POA: Diagnosis not present

## 2020-01-26 DIAGNOSIS — K5900 Constipation, unspecified: Secondary | ICD-10-CM | POA: Diagnosis not present

## 2020-01-26 DIAGNOSIS — E785 Hyperlipidemia, unspecified: Secondary | ICD-10-CM | POA: Diagnosis not present

## 2020-01-26 DIAGNOSIS — G20 Parkinson's disease: Secondary | ICD-10-CM | POA: Diagnosis not present

## 2020-01-26 DIAGNOSIS — E559 Vitamin D deficiency, unspecified: Secondary | ICD-10-CM | POA: Diagnosis not present

## 2020-01-26 DIAGNOSIS — K219 Gastro-esophageal reflux disease without esophagitis: Secondary | ICD-10-CM | POA: Diagnosis not present

## 2020-01-26 DIAGNOSIS — G3183 Dementia with Lewy bodies: Secondary | ICD-10-CM | POA: Diagnosis not present

## 2020-01-26 DIAGNOSIS — F0281 Dementia in other diseases classified elsewhere with behavioral disturbance: Secondary | ICD-10-CM | POA: Diagnosis not present

## 2020-03-06 DIAGNOSIS — Z8781 Personal history of (healed) traumatic fracture: Secondary | ICD-10-CM | POA: Diagnosis not present

## 2020-03-06 DIAGNOSIS — M461 Sacroiliitis, not elsewhere classified: Secondary | ICD-10-CM | POA: Diagnosis not present

## 2020-03-06 DIAGNOSIS — M5136 Other intervertebral disc degeneration, lumbar region: Secondary | ICD-10-CM | POA: Diagnosis not present

## 2020-03-09 DIAGNOSIS — M5442 Lumbago with sciatica, left side: Secondary | ICD-10-CM | POA: Diagnosis not present

## 2020-03-14 DIAGNOSIS — J9811 Atelectasis: Secondary | ICD-10-CM | POA: Diagnosis not present

## 2020-03-14 DIAGNOSIS — S22080A Wedge compression fracture of T11-T12 vertebra, initial encounter for closed fracture: Secondary | ICD-10-CM | POA: Diagnosis not present

## 2020-03-14 DIAGNOSIS — S22089A Unspecified fracture of T11-T12 vertebra, initial encounter for closed fracture: Secondary | ICD-10-CM | POA: Diagnosis not present

## 2020-03-14 DIAGNOSIS — M4856XA Collapsed vertebra, not elsewhere classified, lumbar region, initial encounter for fracture: Secondary | ICD-10-CM | POA: Diagnosis not present

## 2020-03-14 DIAGNOSIS — S32030A Wedge compression fracture of third lumbar vertebra, initial encounter for closed fracture: Secondary | ICD-10-CM | POA: Diagnosis not present

## 2020-03-14 DIAGNOSIS — M47814 Spondylosis without myelopathy or radiculopathy, thoracic region: Secondary | ICD-10-CM | POA: Diagnosis not present

## 2020-03-14 DIAGNOSIS — N210 Calculus in bladder: Secondary | ICD-10-CM | POA: Diagnosis not present

## 2020-03-14 DIAGNOSIS — K449 Diaphragmatic hernia without obstruction or gangrene: Secondary | ICD-10-CM | POA: Diagnosis not present

## 2020-03-14 DIAGNOSIS — S32020A Wedge compression fracture of second lumbar vertebra, initial encounter for closed fracture: Secondary | ICD-10-CM | POA: Diagnosis not present

## 2020-03-14 DIAGNOSIS — X58XXXA Exposure to other specified factors, initial encounter: Secondary | ICD-10-CM | POA: Diagnosis not present

## 2020-03-14 DIAGNOSIS — N201 Calculus of ureter: Secondary | ICD-10-CM | POA: Diagnosis not present

## 2020-03-14 DIAGNOSIS — K5900 Constipation, unspecified: Secondary | ICD-10-CM | POA: Diagnosis not present

## 2020-03-14 DIAGNOSIS — J984 Other disorders of lung: Secondary | ICD-10-CM | POA: Diagnosis not present

## 2020-03-14 DIAGNOSIS — Z87442 Personal history of urinary calculi: Secondary | ICD-10-CM | POA: Diagnosis not present

## 2020-03-14 DIAGNOSIS — M8589 Other specified disorders of bone density and structure, multiple sites: Secondary | ICD-10-CM | POA: Diagnosis not present

## 2020-03-14 DIAGNOSIS — M4854XA Collapsed vertebra, not elsewhere classified, thoracic region, initial encounter for fracture: Secondary | ICD-10-CM | POA: Diagnosis not present

## 2020-03-14 DIAGNOSIS — I700 Atherosclerosis of aorta: Secondary | ICD-10-CM | POA: Diagnosis not present

## 2020-03-14 DIAGNOSIS — R918 Other nonspecific abnormal finding of lung field: Secondary | ICD-10-CM | POA: Diagnosis not present

## 2020-03-15 DIAGNOSIS — M5442 Lumbago with sciatica, left side: Secondary | ICD-10-CM | POA: Diagnosis not present

## 2020-03-27 DIAGNOSIS — R109 Unspecified abdominal pain: Secondary | ICD-10-CM | POA: Diagnosis not present

## 2020-03-27 DIAGNOSIS — R110 Nausea: Secondary | ICD-10-CM | POA: Diagnosis not present

## 2020-03-27 DIAGNOSIS — R197 Diarrhea, unspecified: Secondary | ICD-10-CM | POA: Diagnosis not present

## 2020-03-29 DIAGNOSIS — S32030A Wedge compression fracture of third lumbar vertebra, initial encounter for closed fracture: Secondary | ICD-10-CM | POA: Diagnosis not present

## 2020-03-29 DIAGNOSIS — M48061 Spinal stenosis, lumbar region without neurogenic claudication: Secondary | ICD-10-CM | POA: Diagnosis not present

## 2020-03-29 DIAGNOSIS — M47817 Spondylosis without myelopathy or radiculopathy, lumbosacral region: Secondary | ICD-10-CM | POA: Diagnosis not present

## 2020-04-03 DIAGNOSIS — S32000D Wedge compression fracture of unspecified lumbar vertebra, subsequent encounter for fracture with routine healing: Secondary | ICD-10-CM | POA: Diagnosis not present

## 2020-04-03 DIAGNOSIS — F0391 Unspecified dementia with behavioral disturbance: Secondary | ICD-10-CM | POA: Diagnosis not present

## 2020-04-03 DIAGNOSIS — G20 Parkinson's disease: Secondary | ICD-10-CM | POA: Diagnosis not present

## 2020-04-03 DIAGNOSIS — K219 Gastro-esophageal reflux disease without esophagitis: Secondary | ICD-10-CM | POA: Diagnosis not present

## 2020-04-18 DIAGNOSIS — M545 Low back pain, unspecified: Secondary | ICD-10-CM | POA: Diagnosis not present

## 2020-04-18 DIAGNOSIS — N201 Calculus of ureter: Secondary | ICD-10-CM | POA: Diagnosis not present

## 2020-04-18 DIAGNOSIS — N210 Calculus in bladder: Secondary | ICD-10-CM | POA: Diagnosis not present
